# Patient Record
Sex: Male | Born: 1975 | Race: Asian | Hispanic: No | Marital: Married | Smoking: Former smoker
Health system: Southern US, Community
[De-identification: ages and names within clinical notes are randomized; demographics above are authoritative.]

## PROBLEM LIST (undated history)

## (undated) DIAGNOSIS — I10 Essential (primary) hypertension: Secondary | ICD-10-CM

## (undated) DIAGNOSIS — E785 Hyperlipidemia, unspecified: Secondary | ICD-10-CM

## (undated) DIAGNOSIS — R079 Chest pain, unspecified: Secondary | ICD-10-CM

## (undated) DIAGNOSIS — IMO0001 Reserved for inherently not codable concepts without codable children: Secondary | ICD-10-CM

## (undated) DIAGNOSIS — K219 Gastro-esophageal reflux disease without esophagitis: Secondary | ICD-10-CM

## (undated) HISTORY — DX: Hyperlipidemia, unspecified: E78.5

---

## 2009-04-19 DIAGNOSIS — R079 Chest pain, unspecified: Secondary | ICD-10-CM

## 2009-04-19 HISTORY — DX: Chest pain, unspecified: R07.9

## 2009-11-06 ENCOUNTER — Encounter (INDEPENDENT_AMBULATORY_CARE_PROVIDER_SITE_OTHER): Payer: Self-pay | Admitting: Internal Medicine

## 2009-11-06 ENCOUNTER — Observation Stay (HOSPITAL_COMMUNITY): Admission: EM | Admit: 2009-11-06 | Discharge: 2009-11-07 | Payer: Self-pay | Admitting: Emergency Medicine

## 2009-11-06 ENCOUNTER — Ambulatory Visit: Payer: Self-pay | Admitting: Cardiology

## 2009-11-07 ENCOUNTER — Ambulatory Visit: Payer: Self-pay | Admitting: Cardiology

## 2010-05-10 ENCOUNTER — Encounter: Payer: Self-pay | Admitting: Cardiology

## 2010-07-04 LAB — BASIC METABOLIC PANEL
BUN: 10 mg/dL (ref 6–23)
CO2: 28 mEq/L (ref 19–32)
Calcium: 8.5 mg/dL (ref 8.4–10.5)
Chloride: 105 mEq/L (ref 96–112)
Creatinine, Ser: 0.81 mg/dL (ref 0.4–1.5)
GFR calc Af Amer: >60 mL/min (ref >60)
GFR calc non Af Amer: >60 mL/min (ref >60)
Glucose, Bld: 128 mg/dL — ABNORMAL HIGH (ref 70–99)
Potassium: 4.5 mEq/L (ref 3.5–5.1)
Sodium: 138 mEq/L (ref 135–145)

## 2010-07-04 LAB — COMPREHENSIVE METABOLIC PANEL
ALT: 46 U/L (ref 0–53)
AST: 33 U/L (ref 0–37)
Albumin: 4 g/dL (ref 3.5–5.2)
Alkaline Phosphatase: 60 U/L (ref 39–117)
BUN: 9 mg/dL (ref 6–23)
CO2: 27 mEq/L (ref 19–32)
Calcium: 9.4 mg/dL (ref 8.4–10.5)
Chloride: 101 mEq/L (ref 96–112)
Creatinine, Ser: 0.8 mg/dL (ref 0.4–1.5)
GFR calc Af Amer: >60 mL/min (ref >60)
GFR calc non Af Amer: >60 mL/min (ref >60)
Glucose, Bld: 290 mg/dL — ABNORMAL HIGH (ref 70–99)
Potassium: 3.9 mEq/L (ref 3.5–5.1)
Sodium: 136 mEq/L (ref 135–145)
Total Bilirubin: 0.5 mg/dL (ref 0.3–1.2)
Total Protein: 7.6 g/dL (ref 6.0–8.3)

## 2010-07-04 LAB — CARDIAC PANEL(CRET KIN+CKTOT+MB+TROPI)
CK, MB: 1.3 ng/mL (ref 0.3–4.0)
CK, MB: 1.5 ng/mL (ref 0.3–4.0)
CK, MB: 1.6 ng/mL (ref 0.3–4.0)
Relative Index: 0.6 (ref 0.0–2.5)
Relative Index: 0.6 (ref 0.0–2.5)
Relative Index: 0.6 (ref 0.0–2.5)
Total CK: 209 U/L (ref 7–232)
Total CK: 240 U/L — ABNORMAL HIGH (ref 7–232)
Total CK: 254 U/L — ABNORMAL HIGH (ref 7–232)
Troponin I: 0.01 ng/mL (ref 0.00–0.06)
Troponin I: 0.01 ng/mL (ref 0.00–0.06)
Troponin I: <0.01 ng/mL (ref 0.00–0.06)

## 2010-07-04 LAB — DIFFERENTIAL
Basophils Absolute: 0.1 10*3/uL (ref 0.0–0.1)
Basophils Relative: 1 % (ref 0–1)
Eosinophils Absolute: 0.3 10*3/uL (ref 0.0–0.7)
Eosinophils Relative: 3 % (ref 0–5)
Lymphocytes Relative: 33 % (ref 12–46)
Lymphs Abs: 3.2 10*3/uL (ref 0.7–4.0)
Monocytes Absolute: 0.5 10*3/uL (ref 0.1–1.0)
Monocytes Relative: 5 % (ref 3–12)
Neutro Abs: 5.5 10*3/uL (ref 1.7–7.7)
Neutrophils Relative %: 58 % (ref 43–77)

## 2010-07-04 LAB — LIPID PANEL
Cholesterol: 143 mg/dL (ref 0–200)
HDL: 33 mg/dL — ABNORMAL LOW (ref >39)
LDL Cholesterol: 72 mg/dL (ref 0–99)
Total CHOL/HDL Ratio: 4.3 RATIO
Triglycerides: 189 mg/dL — ABNORMAL HIGH (ref <150)
VLDL: 38 mg/dL (ref 0–40)

## 2010-07-04 LAB — HEMOGLOBIN A1C
Hgb A1c MFr Bld: 8.5 % — ABNORMAL HIGH (ref <5.7)
Mean Plasma Glucose: 197 mg/dL — ABNORMAL HIGH (ref <117)

## 2010-07-04 LAB — CBC
HCT: 39 % (ref 39.0–52.0)
HCT: 43.4 % (ref 39.0–52.0)
Hemoglobin: 13.1 g/dL (ref 13.0–17.0)
Hemoglobin: 14.6 g/dL (ref 13.0–17.0)
MCH: 27.3 pg (ref 26.0–34.0)
MCH: 27.3 pg (ref 26.0–34.0)
MCHC: 33.6 g/dL (ref 30.0–36.0)
MCHC: 33.8 g/dL (ref 30.0–36.0)
MCV: 80.8 fL (ref 78.0–100.0)
MCV: 81.2 fL (ref 78.0–100.0)
Platelets: 200 10*3/uL (ref 150–400)
Platelets: 232 10*3/uL (ref 150–400)
RBC: 4.8 MIL/uL (ref 4.22–5.81)
RBC: 5.37 MIL/uL (ref 4.22–5.81)
RDW: 13.6 % (ref 11.5–15.5)
RDW: 14 % (ref 11.5–15.5)
WBC: 10 10*3/uL (ref 4.0–10.5)
WBC: 9.5 10*3/uL (ref 4.0–10.5)

## 2010-07-04 LAB — POCT CARDIAC MARKERS
CKMB, poc: 1.4 ng/mL (ref 1.0–8.0)
Myoglobin, poc: 96.3 ng/mL (ref 12–200)
Troponin i, poc: <0.05 ng/mL (ref 0.00–0.09)

## 2010-07-04 LAB — RAPID URINE DRUG SCREEN, HOSP PERFORMED
Amphetamines: NOT DETECTED
Barbiturates: NOT DETECTED
Benzodiazepines: NOT DETECTED
Cocaine: NOT DETECTED
Opiates: NOT DETECTED
Tetrahydrocannabinol: NOT DETECTED

## 2010-07-04 LAB — GLUCOSE, CAPILLARY
Glucose-Capillary: 130 mg/dL — ABNORMAL HIGH (ref 70–99)
Glucose-Capillary: 134 mg/dL — ABNORMAL HIGH (ref 70–99)
Glucose-Capillary: 137 mg/dL — ABNORMAL HIGH (ref 70–99)
Glucose-Capillary: 154 mg/dL — ABNORMAL HIGH (ref 70–99)
Glucose-Capillary: 157 mg/dL — ABNORMAL HIGH (ref 70–99)
Glucose-Capillary: 161 mg/dL — ABNORMAL HIGH (ref 70–99)

## 2010-07-04 LAB — D-DIMER, QUANTITATIVE: D-Dimer, Quant: <0.22 ug/mL-FEU (ref 0.00–0.48)

## 2011-07-19 ENCOUNTER — Other Ambulatory Visit: Payer: Self-pay | Admitting: Family Medicine

## 2011-07-19 ENCOUNTER — Ambulatory Visit
Admission: RE | Admit: 2011-07-19 | Discharge: 2011-07-19 | Disposition: A | Discharge status: Home / Self Care | Payer: Commercial Indemnity | Source: Ambulatory Visit | Attending: Family Medicine | Admitting: Family Medicine

## 2011-07-19 DIAGNOSIS — M25519 Pain in unspecified shoulder: Secondary | ICD-10-CM

## 2011-11-01 ENCOUNTER — Other Ambulatory Visit: Payer: Self-pay | Admitting: Family Medicine

## 2011-11-01 ENCOUNTER — Ambulatory Visit
Admission: RE | Admit: 2011-11-01 | Discharge: 2011-11-01 | Disposition: A | Discharge status: Home / Self Care | Payer: Commercial Indemnity | Source: Ambulatory Visit | Attending: Family Medicine | Admitting: Family Medicine

## 2011-11-01 DIAGNOSIS — M25561 Pain in right knee: Secondary | ICD-10-CM

## 2011-12-30 ENCOUNTER — Encounter (HOSPITAL_BASED_OUTPATIENT_CLINIC_OR_DEPARTMENT_OTHER): Payer: Self-pay | Admitting: *Deleted

## 2011-12-30 ENCOUNTER — Emergency Department (HOSPITAL_BASED_OUTPATIENT_CLINIC_OR_DEPARTMENT_OTHER): Payer: Managed Care, Other (non HMO)

## 2011-12-30 ENCOUNTER — Emergency Department (HOSPITAL_BASED_OUTPATIENT_CLINIC_OR_DEPARTMENT_OTHER)
Admission: EM | Admit: 2011-12-30 | Discharge: 2011-12-30 | Disposition: A | Discharge status: Home / Self Care | Payer: Managed Care, Other (non HMO) | Attending: Emergency Medicine | Admitting: Emergency Medicine

## 2011-12-30 DIAGNOSIS — F17200 Nicotine dependence, unspecified, uncomplicated: Secondary | ICD-10-CM | POA: Insufficient documentation

## 2011-12-30 DIAGNOSIS — E119 Type 2 diabetes mellitus without complications: Secondary | ICD-10-CM | POA: Insufficient documentation

## 2011-12-30 DIAGNOSIS — I10 Essential (primary) hypertension: Secondary | ICD-10-CM | POA: Insufficient documentation

## 2011-12-30 DIAGNOSIS — M62838 Other muscle spasm: Secondary | ICD-10-CM | POA: Insufficient documentation

## 2011-12-30 DIAGNOSIS — R0789 Other chest pain: Secondary | ICD-10-CM

## 2011-12-30 HISTORY — DX: Gastro-esophageal reflux disease without esophagitis: K21.9

## 2011-12-30 HISTORY — DX: Chest pain, unspecified: R07.9

## 2011-12-30 HISTORY — DX: Reserved for inherently not codable concepts without codable children: IMO0001

## 2011-12-30 HISTORY — DX: Essential (primary) hypertension: I10

## 2011-12-30 LAB — COMPREHENSIVE METABOLIC PANEL
ALT: 61 U/L — ABNORMAL HIGH (ref 0–53)
AST: 47 U/L — ABNORMAL HIGH (ref 0–37)
Albumin: 4.2 g/dL (ref 3.5–5.2)
Alkaline Phosphatase: 73 U/L (ref 39–117)
BUN: 9 mg/dL (ref 6–23)
CO2: 24 mEq/L (ref 19–32)
Calcium: 9.8 mg/dL (ref 8.4–10.5)
Chloride: 96 mEq/L (ref 96–112)
Creatinine, Ser: 0.6 mg/dL (ref 0.50–1.35)
GFR calc Af Amer: >90 mL/min (ref >90)
GFR calc non Af Amer: >90 mL/min (ref >90)
Glucose, Bld: 253 mg/dL — ABNORMAL HIGH (ref 70–99)
Potassium: 3.9 mEq/L (ref 3.5–5.1)
Sodium: 135 mEq/L (ref 135–145)
Total Bilirubin: 0.3 mg/dL (ref 0.3–1.2)
Total Protein: 8.2 g/dL (ref 6.0–8.3)

## 2011-12-30 LAB — CBC WITH DIFFERENTIAL/PLATELET
Basophils Absolute: 0 10*3/uL (ref 0.0–0.1)
Basophils Relative: 0 % (ref 0–1)
Eosinophils Absolute: 0.2 10*3/uL (ref 0.0–0.7)
Eosinophils Relative: 2 % (ref 0–5)
HCT: 44.8 % (ref 39.0–52.0)
Hemoglobin: 15.2 g/dL (ref 13.0–17.0)
Lymphocytes Relative: 28 % (ref 12–46)
Lymphs Abs: 2.9 10*3/uL (ref 0.7–4.0)
MCH: 25.8 pg — ABNORMAL LOW (ref 26.0–34.0)
MCHC: 33.9 g/dL (ref 30.0–36.0)
MCV: 76.1 fL — ABNORMAL LOW (ref 78.0–100.0)
Monocytes Absolute: 0.7 10*3/uL (ref 0.1–1.0)
Monocytes Relative: 7 % (ref 3–12)
Neutro Abs: 6.3 10*3/uL (ref 1.7–7.7)
Neutrophils Relative %: 62 % (ref 43–77)
Platelets: 250 10*3/uL (ref 150–400)
RBC: 5.89 MIL/uL — ABNORMAL HIGH (ref 4.22–5.81)
RDW: 13.9 % (ref 11.5–15.5)
WBC: 10.1 10*3/uL (ref 4.0–10.5)

## 2011-12-30 LAB — TROPONIN I
Troponin I: <0.3 ng/mL (ref <0.30)
Troponin I: <0.3 ng/mL (ref <0.30)

## 2011-12-30 LAB — URINALYSIS, ROUTINE W REFLEX MICROSCOPIC
Bilirubin Urine: NEGATIVE
Glucose, UA: NEGATIVE mg/dL
Hgb urine dipstick: NEGATIVE
Ketones, ur: NEGATIVE mg/dL
Leukocytes, UA: NEGATIVE
Nitrite: NEGATIVE
Protein, ur: NEGATIVE mg/dL
Specific Gravity, Urine: 1.01 (ref 1.005–1.030)
Urobilinogen, UA: 0.2 mg/dL (ref 0.0–1.0)
pH: 7 (ref 5.0–8.0)

## 2011-12-30 LAB — GLUCOSE, CAPILLARY: Glucose-Capillary: 272 mg/dL — ABNORMAL HIGH (ref 70–99)

## 2011-12-30 NOTE — ED Notes (Signed)
Patient states he developed a tremor in his lower extremities last night while sleeping.  States he woke up this morning with tingling in his left chest with mild sob.

## 2012-01-08 NOTE — ED Provider Notes (Signed)
History     CSN: [id number]  Arrival date & time 12/30/11  1216   First MD Initiated Contact with Patient 12/30/11 1309      Chief Complaint  Patient presents with  . Chest Pain    (Consider location/radiation/quality/duration/timing/severity/associated sxs/prior treatment) HPI The patient presents to the ER with tingling in his mid chest that began early this morning. The patient states that he had some tremor like activity in his lower legs for a few minutes last night. The patient states that he has not had SOB, weakness, numbness, vomiting, nausea, headache, fever, back pain, abdominal pain, syncope, or diarrhea.  The patient states that nothing seems to make the sensation better or worse. The patient does state that the tingling sensation is gone and has been since his arrival. The patient denies any treatment prior to arrival for his symptoms. The patient states that he had a cardiac workup in 2011. The patient states that at that time there was no abnormalities.The patient states that the sensation would only last for seconds at a time. Past Medical History  Diagnosis Date  . Chest pain 2011  . Hypertension   . Diabetes mellitus   . Reflux     History reviewed. No pertinent past surgical history.  No family history on file.  History  Substance Use Topics  . Smoking status: Current Every Day Smoker  . Smokeless tobacco: Not on file  . Alcohol Use: No      Review of Systems All other systems negative except as documented in the HPI. All pertinent positives and negatives as reviewed in the HPI.  Allergies  Review of patient's allergies indicates no known allergies.  Home Medications   Current Outpatient Rx  Name Route Sig Dispense Refill  . METFORMIN HCL 500 MG PO TABS Oral Take 500 mg by mouth 2 (two) times daily with a meal.    . OLMESARTAN MEDOXOMIL 20 MG PO TABS Oral Take 20 mg by mouth daily.    Marland Kitchen OMEPRAZOLE 20 MG PO CPDR Oral Take 20 mg by mouth daily.       BP 124/80  Pulse 93  Temp 98.4 F (36.9 C) (Oral)  Resp 18  Ht 6' (1.829 m)  Wt 225 lb (102.059 kg)  BMI 30.52 kg/m2  SpO2 97%  Physical Exam  Nursing note and vitals reviewed. Constitutional: He is oriented to person, place, and time. He appears well-developed and well-nourished.  HENT:  Head: Normocephalic and atraumatic.  Mouth/Throat: Oropharynx is clear and moist. No oropharyngeal exudate.  Eyes: Pupils are equal, round, and reactive to light.  Neck: Normal range of motion. Neck supple.  Cardiovascular: Normal rate, regular rhythm and normal heart sounds.  Exam reveals no gallop and no friction rub.   No murmur heard. Pulmonary/Chest: Effort normal and breath sounds normal.  Abdominal: Soft. Bowel sounds are normal. He exhibits no distension. There is no tenderness.  Neurological: He is alert and oriented to person, place, and time.  Skin: Skin is warm and dry. No rash noted.    ED Course  Procedures (including critical care time)  Labs Reviewed  GLUCOSE, CAPILLARY - Abnormal; Notable for the following:    Glucose-Capillary 272 (*)     All other components within normal limits  COMPREHENSIVE METABOLIC PANEL - Abnormal; Notable for the following:    Glucose, Bld 253 (*)     AST 47 (*)     ALT 61 (*)     All other components within normal  limits  CBC WITH DIFFERENTIAL - Abnormal; Notable for the following:    RBC 5.89 (*)     MCV 76.1 (*)     MCH 25.8 (*)     All other components within normal limits  URINALYSIS, ROUTINE W REFLEX MICROSCOPIC  TROPONIN I  TROPONIN I     1. Chest discomfort    The patient is advised that this does not seem likely to be a heart related issue based on his HPI and PE along with his testing here in the ER. I advised him that this can't totally be excluded but felt that he would need close follow up on these symptoms. The patient is PERC negative and is low risk based on Wells Criteria. The patient is advised to return to the ER  for any worsening in his condition. The patient was given the plan and all questions were answered.   MDM  MDM Reviewed: vitals and nursing note Interpretation: labs, ECG and x-ray    Date: 01/08/2012  Rate: 97  Rhythm: normal sinus rhythm  QRS Axis: normal  Intervals: normal  ST/T Wave abnormalities: nonspecific T wave changes  Conduction Disutrbances:none  Narrative Interpretation:   Old EKG Reviewed: none available           Carlyle Dolly, PA-C 01/08/12 0801

## 2012-01-10 NOTE — ED Provider Notes (Signed)
Medical screening examination/treatment/procedure(s) were performed by non-physician practitioner and as supervising physician I was immediately available for consultation/collaboration.  Shresta Risden K Linker, MD 01/10/12 0929 

## 2012-02-16 ENCOUNTER — Other Ambulatory Visit: Payer: Self-pay | Admitting: Endocrinology

## 2012-02-16 DIAGNOSIS — E049 Nontoxic goiter, unspecified: Secondary | ICD-10-CM

## 2012-02-28 ENCOUNTER — Ambulatory Visit
Admission: RE | Admit: 2012-02-28 | Discharge: 2012-02-28 | Disposition: A | Discharge status: Home / Self Care | Payer: Managed Care, Other (non HMO) | Source: Ambulatory Visit | Attending: Endocrinology | Admitting: Endocrinology

## 2012-02-28 DIAGNOSIS — E049 Nontoxic goiter, unspecified: Secondary | ICD-10-CM

## 2013-03-19 IMAGING — CR DG SHOULDER 2+V*R*
3 series · 3 of 3 positions shown · non-contrast
Comparison: None.

CLINICAL DATA: Intermittent right shoulder pain

RIGHT SHOULDER - 2+ VIEW

[view not recorded (1 of 3)]
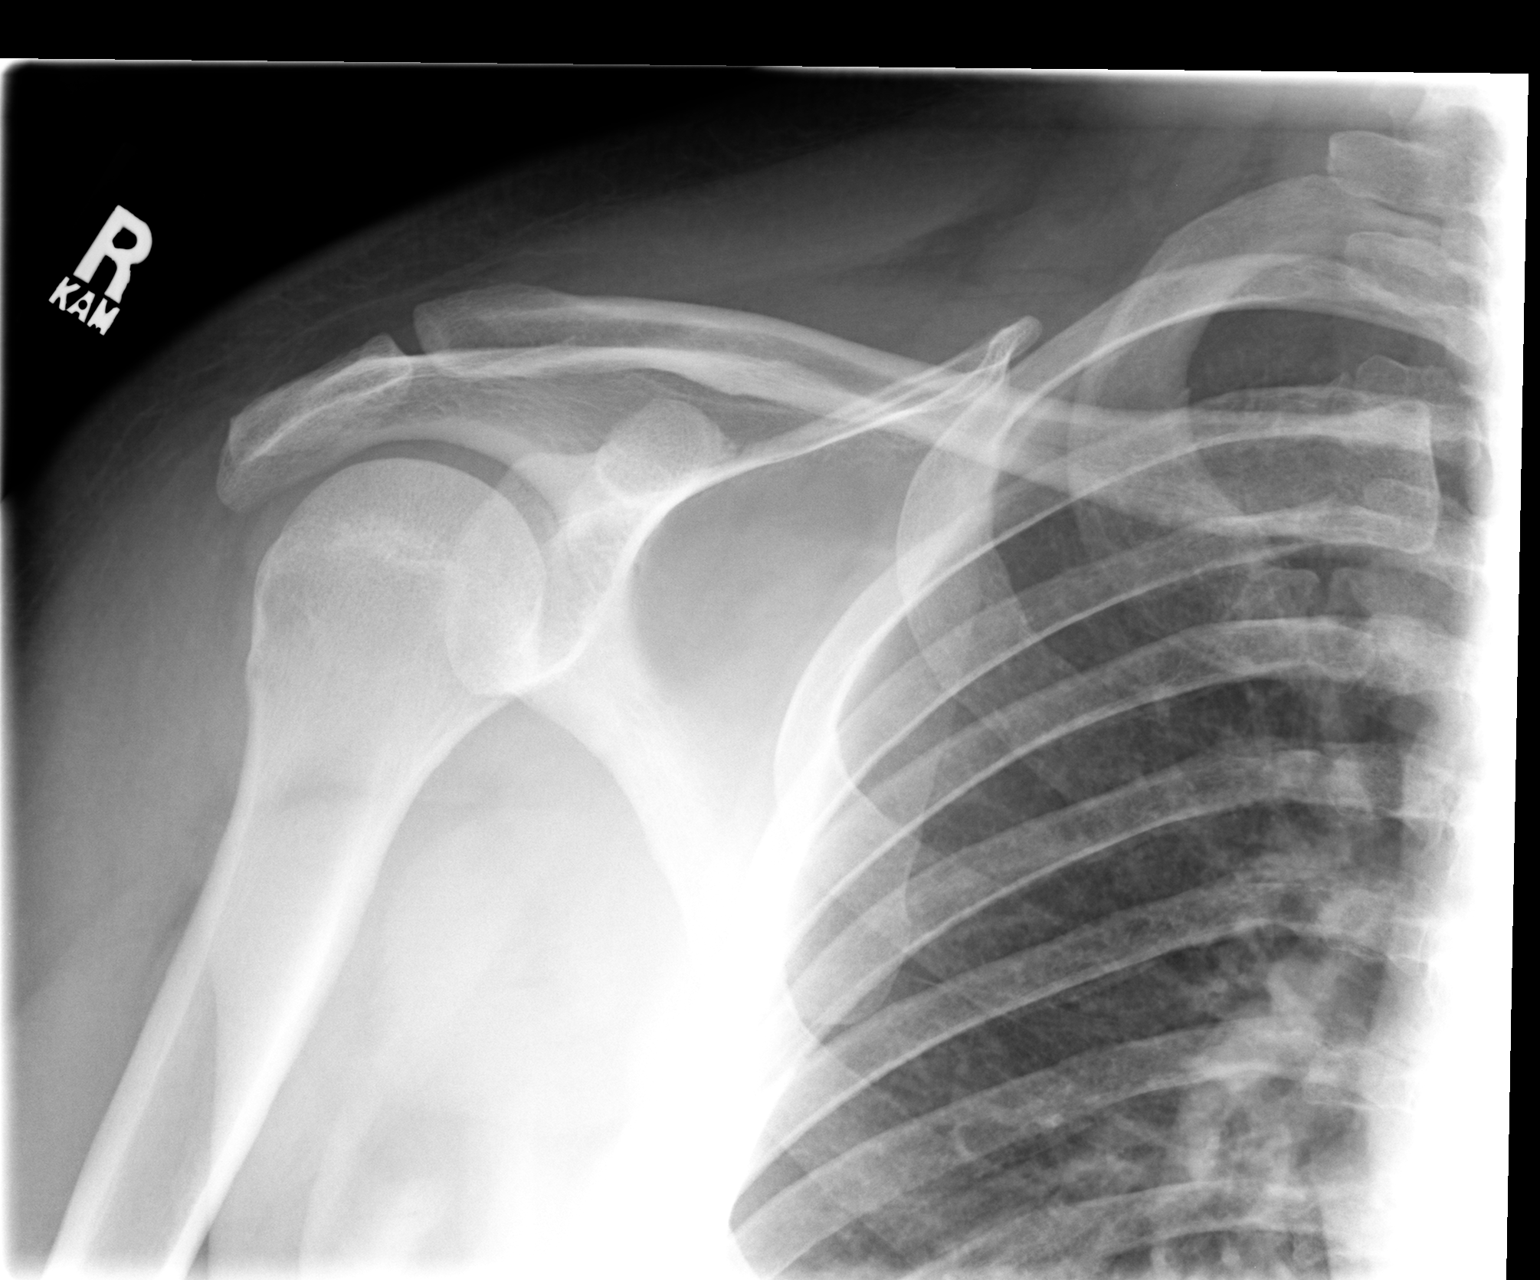

[view not recorded (2 of 3)]
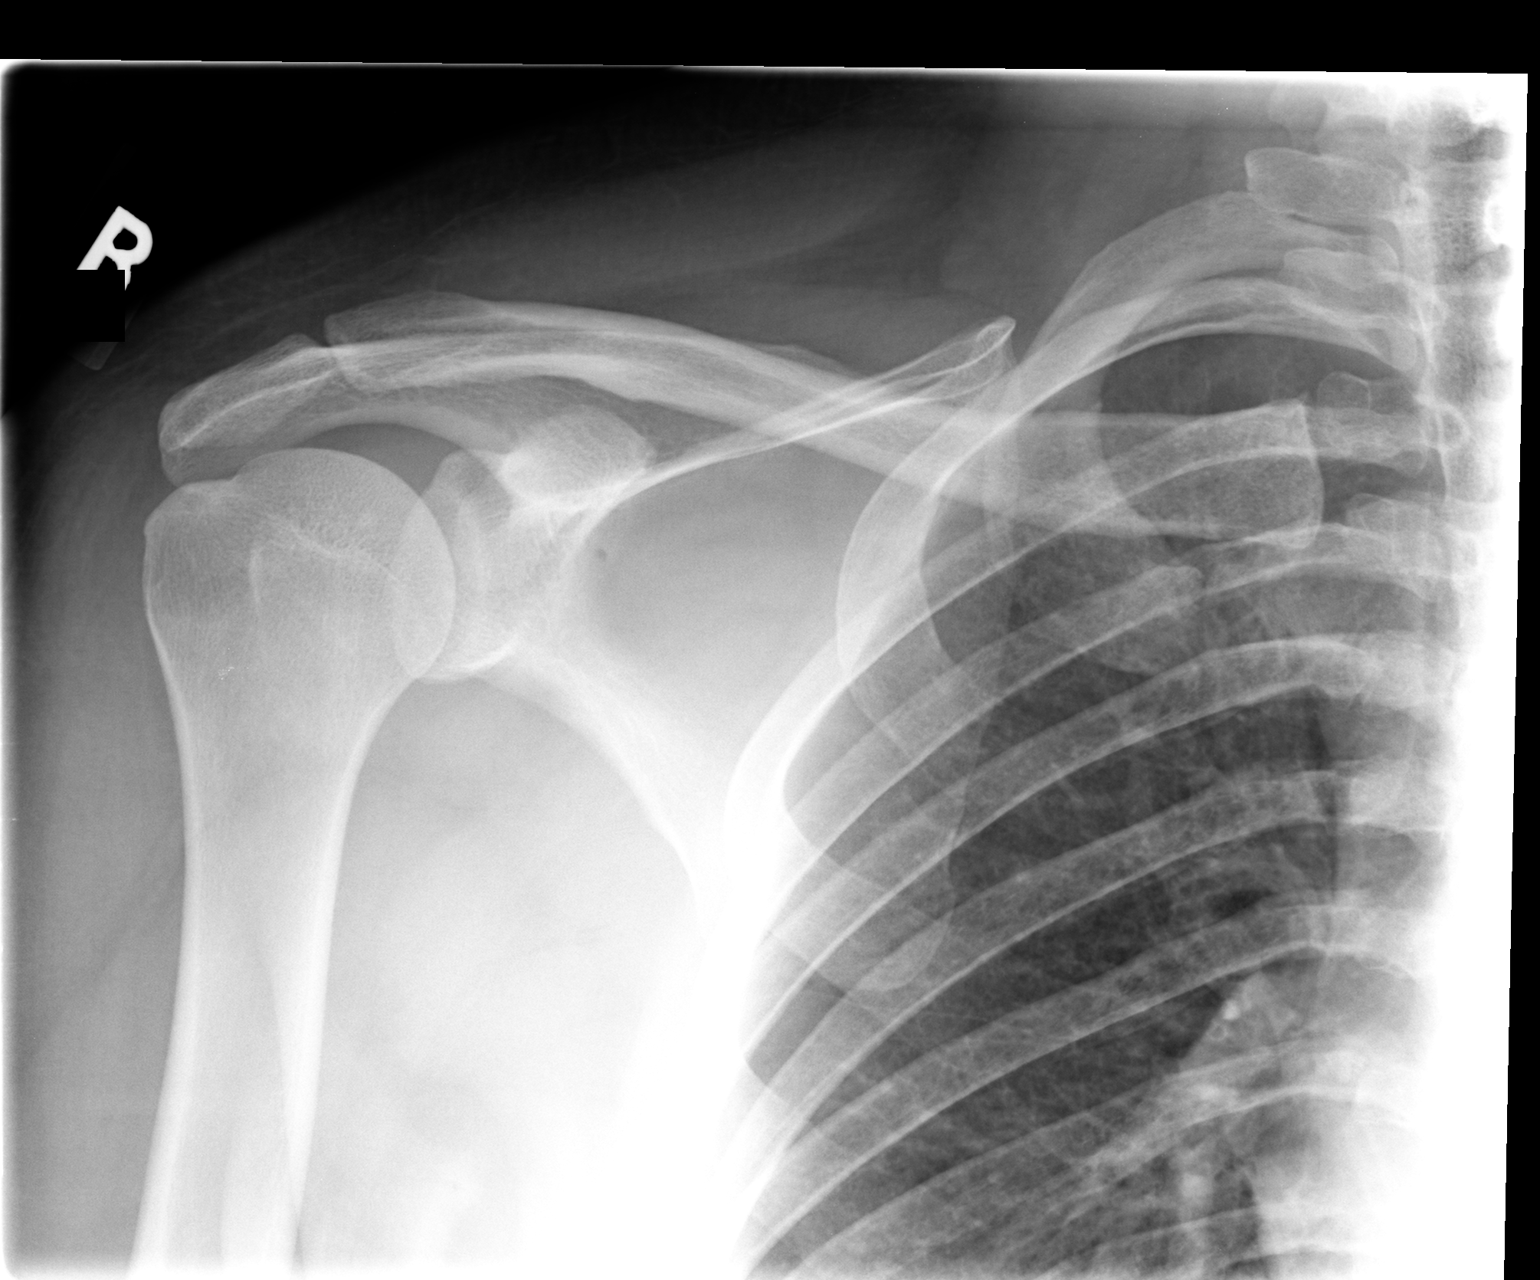

[view not recorded (3 of 3)]
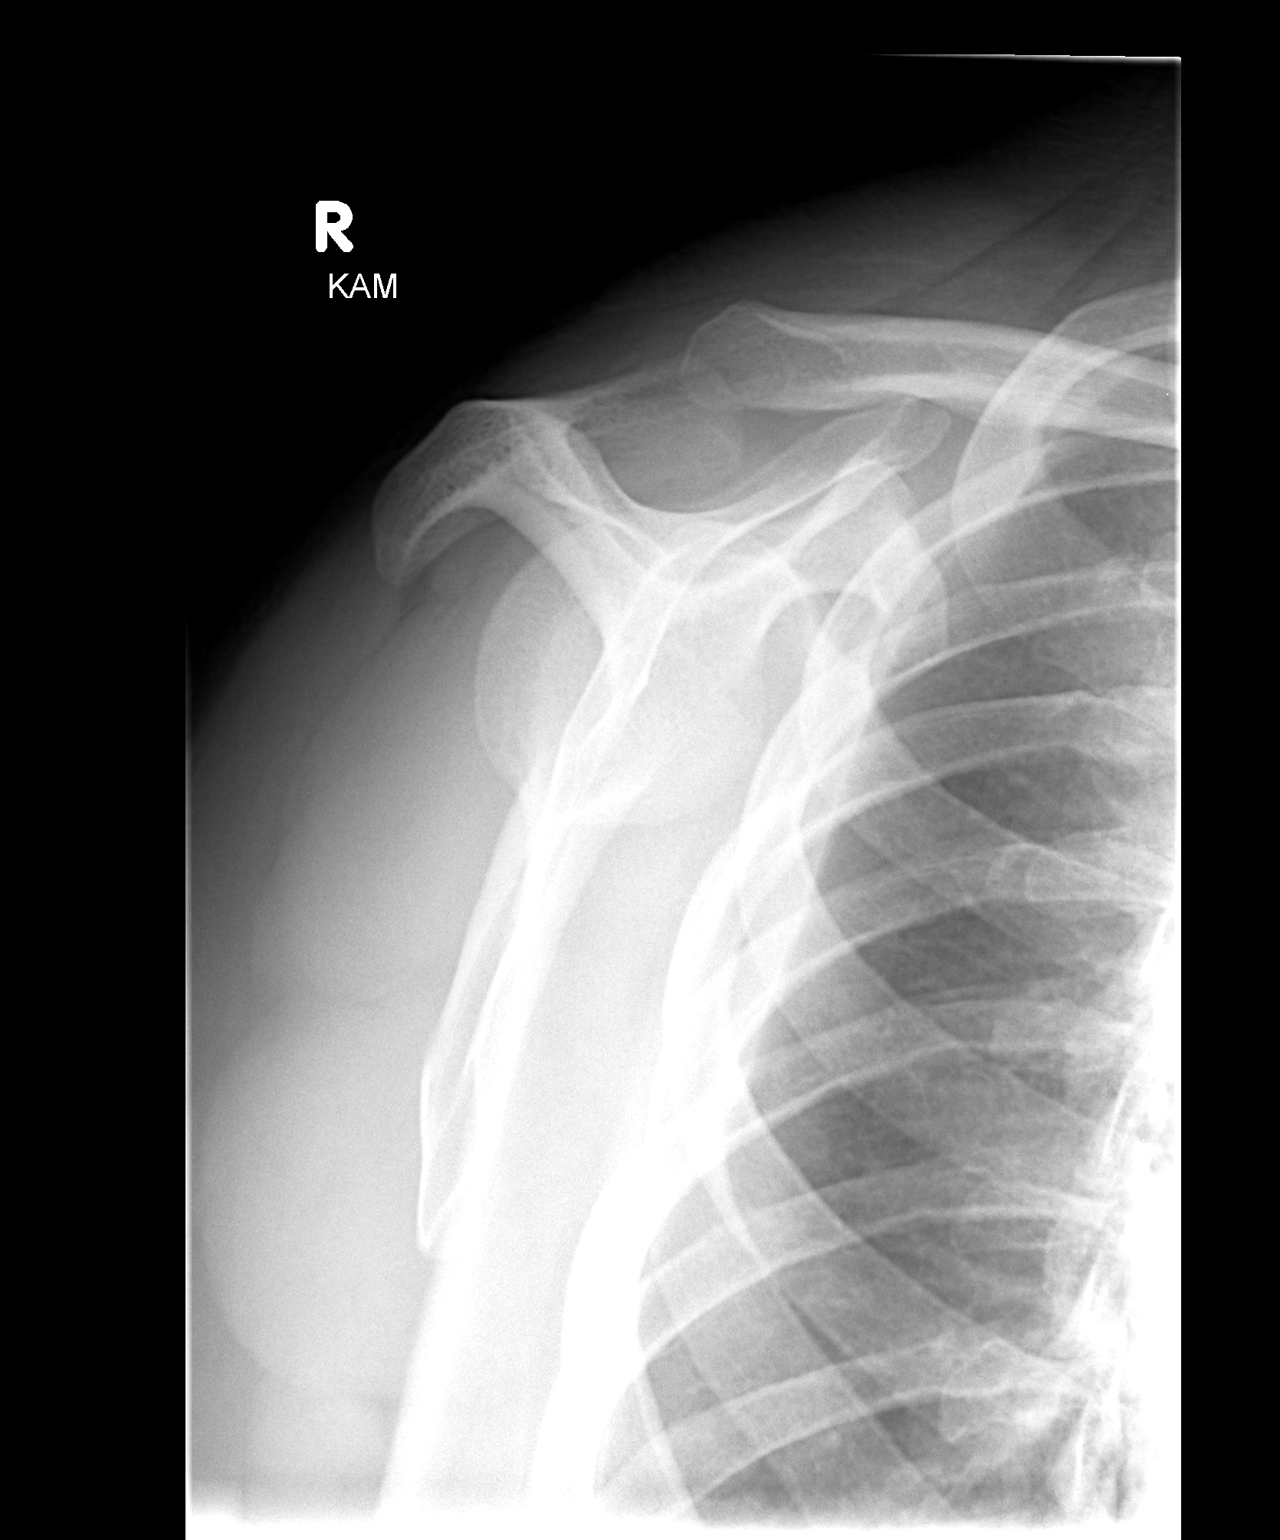

[3 of 3 positions shown; findings below may reference images not displayed]

FINDINGS: No acute fracture and no dislocation.  Unremarkable soft
tissues.
IMPRESSION: No acute bony pathology.

## 2013-03-19 IMAGING — CR DG CHEST 2V
2 series · 2 of 2 positions shown · non-contrast
Comparison: 11/06/2009.

CLINICAL DATA: 36-year-old male with left side chest pain.  Pain in
the ribs.

CHEST - 2 VIEW

[view not recorded (1 of 2)]
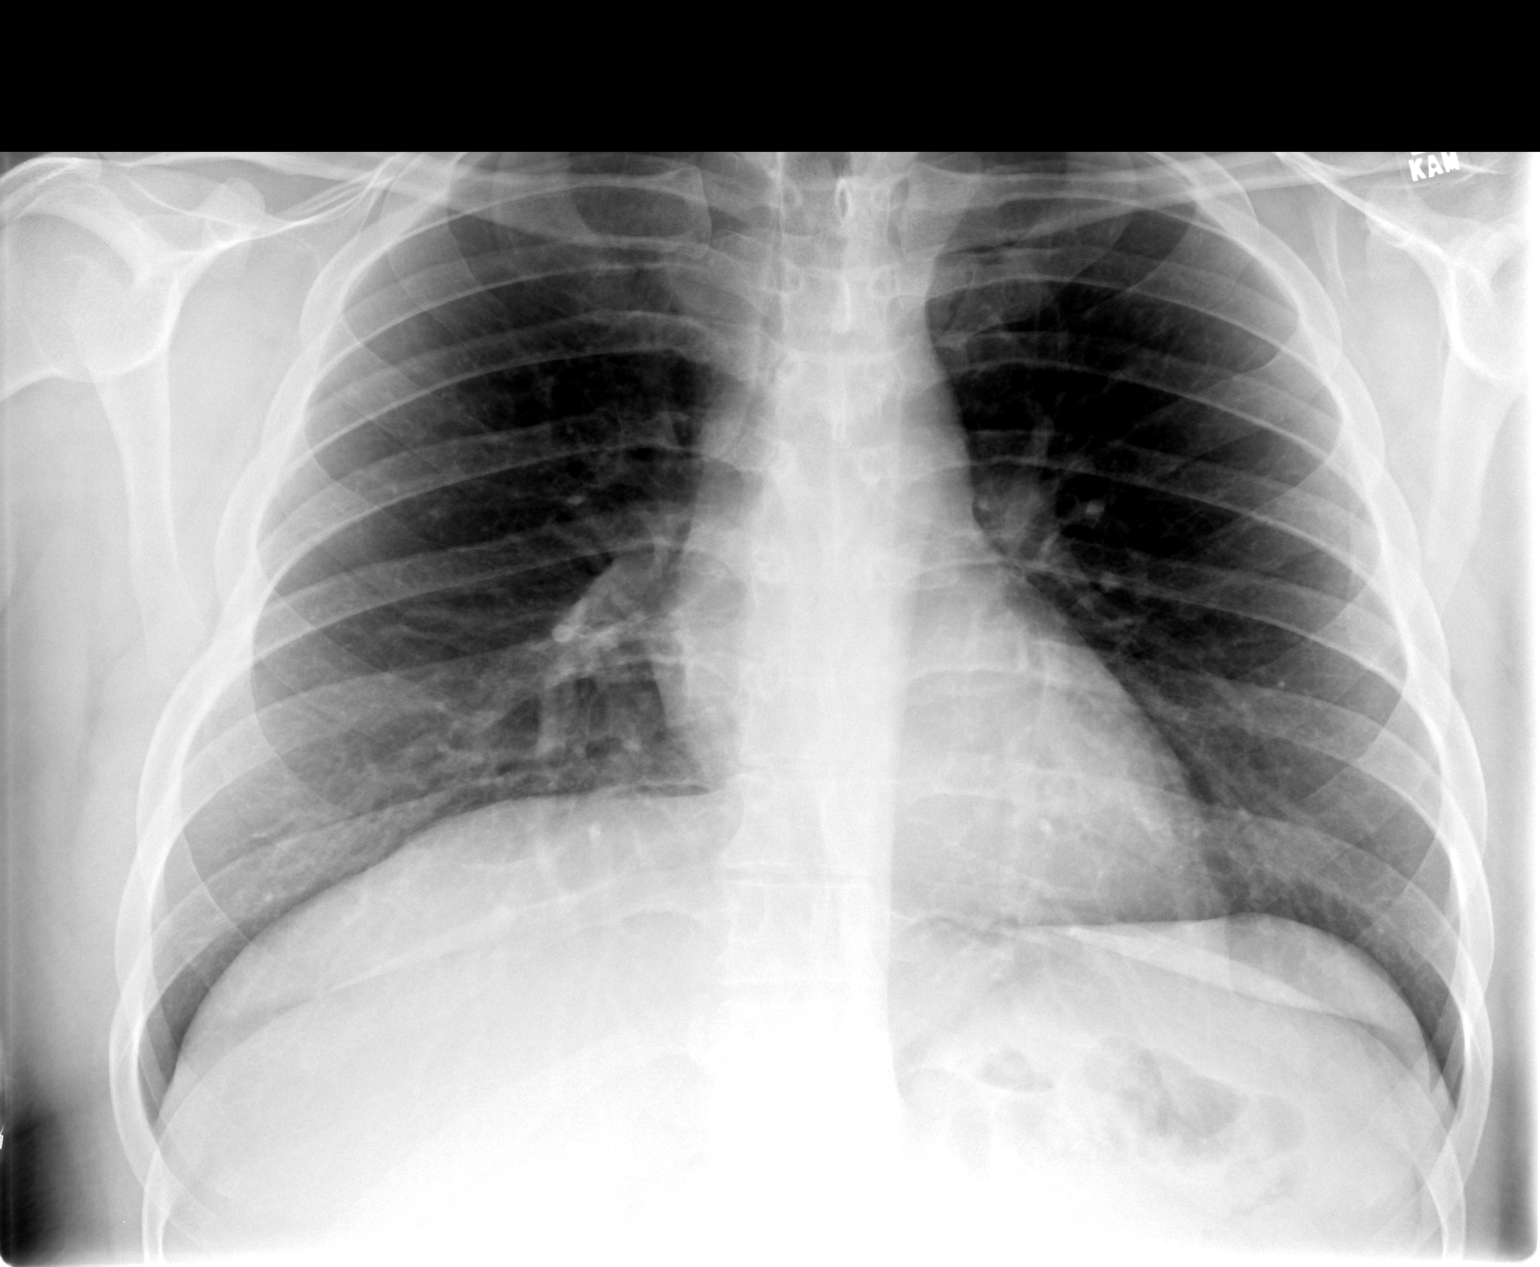

[view not recorded (2 of 2)]
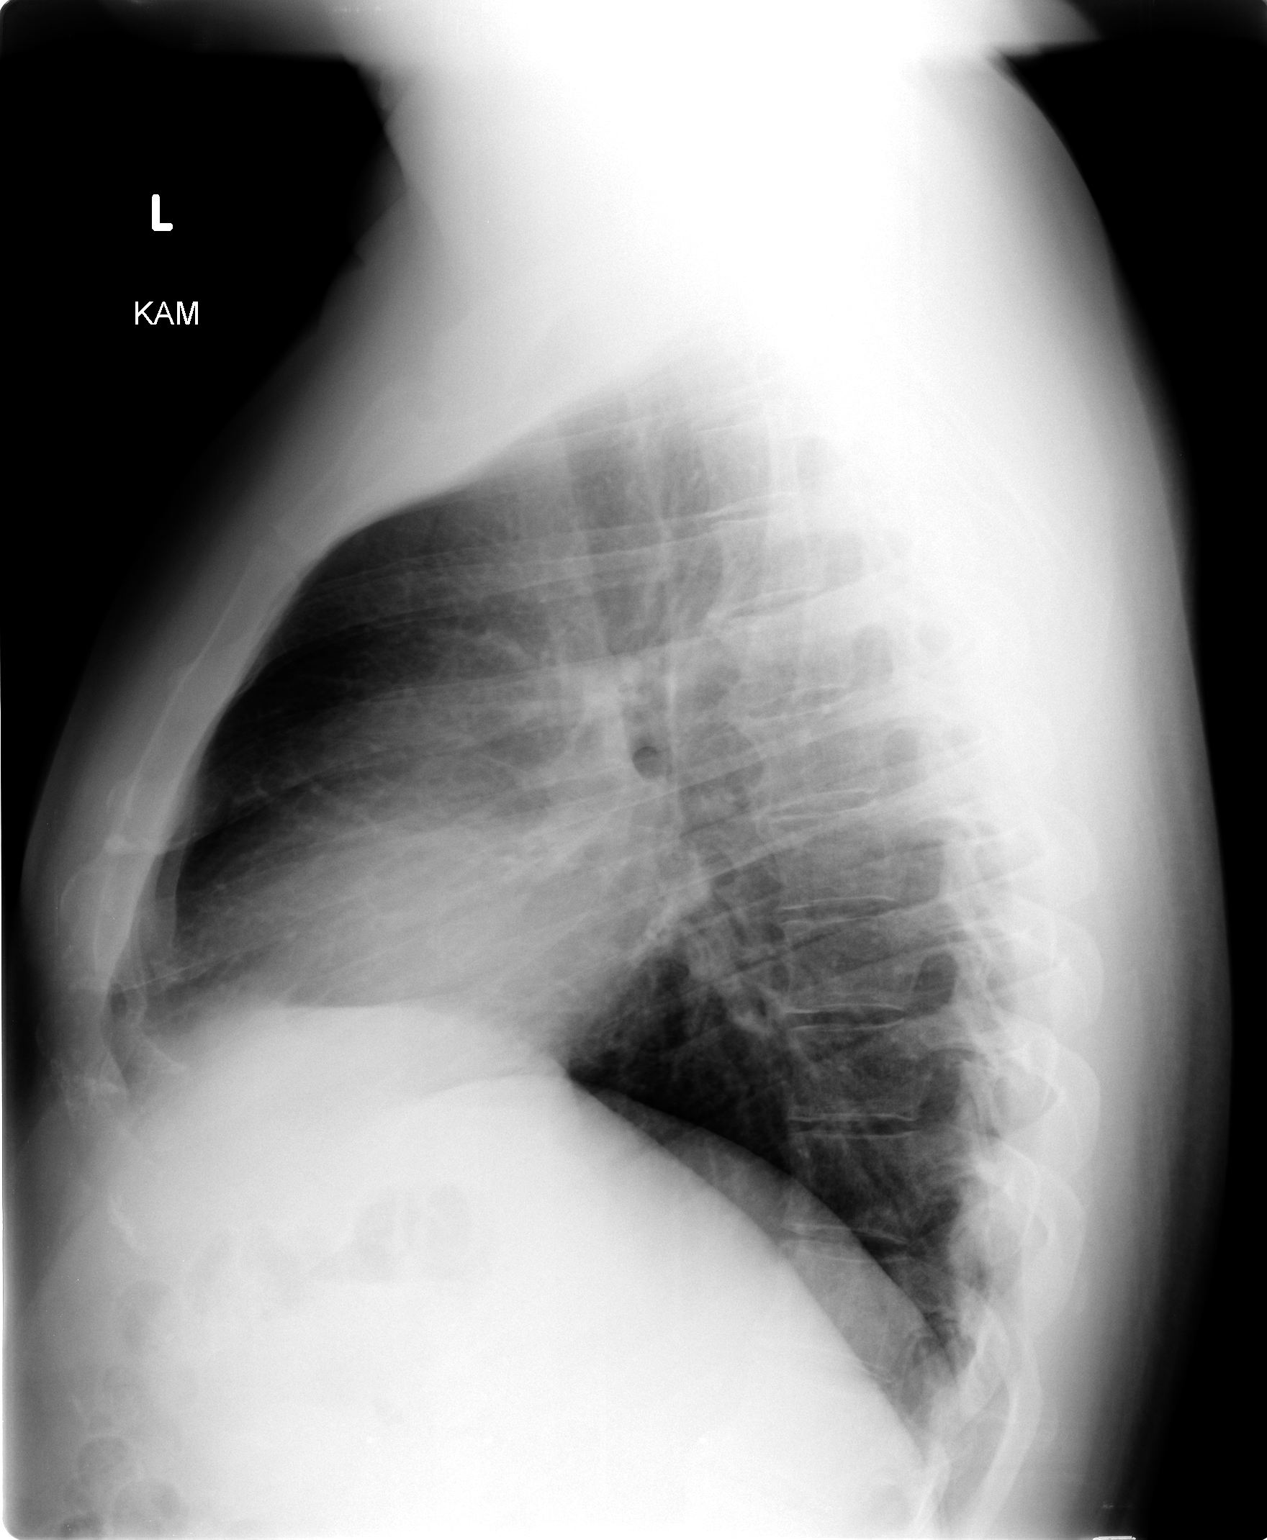

[2 of 2 positions shown; findings below may reference images not displayed]

FINDINGS: Normal lung volumes. Normal cardiac size and mediastinal
contours.  Visualized tracheal air column is within normal limits.
The lungs are clear.  No pneumothorax or effusion. Bone
mineralization is within normal limits. No acute osseous
abnormality identified.
IMPRESSION: Negative, no acute cardiopulmonary abnormality.

## 2015-02-04 ENCOUNTER — Other Ambulatory Visit: Payer: Self-pay | Admitting: Family Medicine

## 2015-02-04 ENCOUNTER — Ambulatory Visit
Admission: RE | Admit: 2015-02-04 | Discharge: 2015-02-04 | Disposition: A | Discharge status: Home / Self Care | Payer: Managed Care, Other (non HMO) | Source: Ambulatory Visit | Attending: Family Medicine | Admitting: Family Medicine

## 2015-02-04 DIAGNOSIS — M542 Cervicalgia: Secondary | ICD-10-CM

## 2017-11-14 DIAGNOSIS — E118 Type 2 diabetes mellitus with unspecified complications: Secondary | ICD-10-CM | POA: Diagnosis not present

## 2017-11-14 DIAGNOSIS — E785 Hyperlipidemia, unspecified: Secondary | ICD-10-CM | POA: Diagnosis not present

## 2017-11-14 DIAGNOSIS — I10 Essential (primary) hypertension: Secondary | ICD-10-CM | POA: Diagnosis not present

## 2017-12-26 DIAGNOSIS — E782 Mixed hyperlipidemia: Secondary | ICD-10-CM | POA: Diagnosis not present

## 2017-12-26 DIAGNOSIS — E118 Type 2 diabetes mellitus with unspecified complications: Secondary | ICD-10-CM | POA: Diagnosis not present

## 2017-12-26 DIAGNOSIS — I10 Essential (primary) hypertension: Secondary | ICD-10-CM | POA: Diagnosis not present

## 2018-01-17 DIAGNOSIS — H60312 Diffuse otitis externa, left ear: Secondary | ICD-10-CM | POA: Diagnosis not present

## 2018-03-08 DIAGNOSIS — Z0110 Encounter for examination of ears and hearing without abnormal findings: Secondary | ICD-10-CM | POA: Diagnosis not present

## 2018-03-08 DIAGNOSIS — H9313 Tinnitus, bilateral: Secondary | ICD-10-CM | POA: Diagnosis not present

## 2018-03-22 DIAGNOSIS — H9313 Tinnitus, bilateral: Secondary | ICD-10-CM | POA: Diagnosis not present

## 2018-04-24 DIAGNOSIS — E785 Hyperlipidemia, unspecified: Secondary | ICD-10-CM | POA: Diagnosis not present

## 2018-04-24 DIAGNOSIS — E118 Type 2 diabetes mellitus with unspecified complications: Secondary | ICD-10-CM | POA: Diagnosis not present

## 2018-04-24 DIAGNOSIS — I10 Essential (primary) hypertension: Secondary | ICD-10-CM | POA: Diagnosis not present

## 2018-05-05 DIAGNOSIS — H60311 Diffuse otitis externa, right ear: Secondary | ICD-10-CM | POA: Insufficient documentation

## 2018-05-05 DIAGNOSIS — H9202 Otalgia, left ear: Secondary | ICD-10-CM | POA: Diagnosis not present

## 2018-06-26 DIAGNOSIS — Z125 Encounter for screening for malignant neoplasm of prostate: Secondary | ICD-10-CM | POA: Diagnosis not present

## 2018-06-26 DIAGNOSIS — M542 Cervicalgia: Secondary | ICD-10-CM | POA: Diagnosis not present

## 2018-06-26 DIAGNOSIS — N400 Enlarged prostate without lower urinary tract symptoms: Secondary | ICD-10-CM | POA: Diagnosis not present

## 2018-08-29 DIAGNOSIS — S91301A Unspecified open wound, right foot, initial encounter: Secondary | ICD-10-CM | POA: Diagnosis not present

## 2018-08-29 DIAGNOSIS — Z7189 Other specified counseling: Secondary | ICD-10-CM | POA: Diagnosis not present

## 2018-08-29 DIAGNOSIS — E1169 Type 2 diabetes mellitus with other specified complication: Secondary | ICD-10-CM | POA: Diagnosis not present

## 2018-08-29 DIAGNOSIS — E1165 Type 2 diabetes mellitus with hyperglycemia: Secondary | ICD-10-CM | POA: Diagnosis not present

## 2018-09-05 DIAGNOSIS — E1169 Type 2 diabetes mellitus with other specified complication: Secondary | ICD-10-CM | POA: Diagnosis not present

## 2018-09-05 DIAGNOSIS — S91301D Unspecified open wound, right foot, subsequent encounter: Secondary | ICD-10-CM | POA: Diagnosis not present

## 2018-09-22 DIAGNOSIS — H9312 Tinnitus, left ear: Secondary | ICD-10-CM | POA: Diagnosis not present

## 2018-09-22 DIAGNOSIS — J342 Deviated nasal septum: Secondary | ICD-10-CM | POA: Diagnosis not present

## 2018-09-22 DIAGNOSIS — J310 Chronic rhinitis: Secondary | ICD-10-CM | POA: Diagnosis not present

## 2018-09-22 DIAGNOSIS — H9042 Sensorineural hearing loss, unilateral, left ear, with unrestricted hearing on the contralateral side: Secondary | ICD-10-CM | POA: Diagnosis not present

## 2018-10-31 DIAGNOSIS — H9042 Sensorineural hearing loss, unilateral, left ear, with unrestricted hearing on the contralateral side: Secondary | ICD-10-CM | POA: Diagnosis not present

## 2018-10-31 DIAGNOSIS — H9312 Tinnitus, left ear: Secondary | ICD-10-CM | POA: Diagnosis not present

## 2018-10-31 DIAGNOSIS — J310 Chronic rhinitis: Secondary | ICD-10-CM | POA: Diagnosis not present

## 2018-12-06 DIAGNOSIS — E1169 Type 2 diabetes mellitus with other specified complication: Secondary | ICD-10-CM | POA: Diagnosis not present

## 2018-12-06 DIAGNOSIS — E785 Hyperlipidemia, unspecified: Secondary | ICD-10-CM | POA: Diagnosis not present

## 2018-12-06 DIAGNOSIS — I10 Essential (primary) hypertension: Secondary | ICD-10-CM | POA: Diagnosis not present

## 2019-04-10 DIAGNOSIS — E782 Mixed hyperlipidemia: Secondary | ICD-10-CM | POA: Diagnosis not present

## 2019-04-10 DIAGNOSIS — E1169 Type 2 diabetes mellitus with other specified complication: Secondary | ICD-10-CM | POA: Diagnosis not present

## 2019-04-10 DIAGNOSIS — I10 Essential (primary) hypertension: Secondary | ICD-10-CM | POA: Diagnosis not present

## 2019-05-01 DIAGNOSIS — N400 Enlarged prostate without lower urinary tract symptoms: Secondary | ICD-10-CM | POA: Diagnosis not present

## 2019-05-01 DIAGNOSIS — Z125 Encounter for screening for malignant neoplasm of prostate: Secondary | ICD-10-CM | POA: Diagnosis not present

## 2019-07-23 ENCOUNTER — Ambulatory Visit: Payer: Self-pay | Attending: Internal Medicine

## 2019-07-23 DIAGNOSIS — Z23 Encounter for immunization: Secondary | ICD-10-CM

## 2019-07-23 NOTE — Progress Notes (Signed)
   Covid-19 Vaccination Clinic  Name:  Robi Dewolfe    MRN: [id number] DOB: 1975/04/22  07/23/2019  Mr. Ogawa was observed post Covid-19 immunization for 15 minutes without incident. He was provided with Vaccine Information Sheet and instruction to access the V-Safe system.   Mr. Shaheen was instructed to call 911 with any severe reactions post vaccine: Marland Kitchen Difficulty breathing  . Swelling of face and throat  . A fast heartbeat  . A bad rash all over body  . Dizziness and weakness   Immunizations Administered    Name Date Dose VIS Date Route   Pfizer COVID-19 Vaccine 07/23/2019 12:10 PM 0.3 mL 03/30/2019 Intramuscular   Manufacturer: [employer], [employer]   Lot: [id number]   NDC: [address]-2

## 2019-08-13 ENCOUNTER — Ambulatory Visit: Payer: Self-pay | Attending: Internal Medicine

## 2019-08-13 DIAGNOSIS — Z23 Encounter for immunization: Secondary | ICD-10-CM

## 2019-08-13 NOTE — Progress Notes (Signed)
   Covid-19 Vaccination Clinic  Name:  Philip [name]    MRN: [id number] DOB: [birth year]  08/13/2019  Philip [name] was observed post Covid-19 immunization for 15 minutes without incident. He was provided with Vaccine Information Sheet and instruction to access the V-Safe system.   Philip [name] was instructed to call 911 with any severe reactions post vaccine: Marland Kitchen Difficulty breathing  . Swelling of face and throat  . A fast heartbeat  . A bad rash all over body  . Dizziness and weakness   Immunizations Administered    Name Date Dose VIS Date Route   Pfizer COVID-19 Vaccine 08/13/2019  2:56 PM 0.3 mL 06/13/2018 Intramuscular   Manufacturer: [employer], [employer]   Lot: [id number]   NDC: [address]-5

## 2019-08-27 DIAGNOSIS — M542 Cervicalgia: Secondary | ICD-10-CM | POA: Diagnosis not present

## 2019-08-27 DIAGNOSIS — E1169 Type 2 diabetes mellitus with other specified complication: Secondary | ICD-10-CM | POA: Diagnosis not present

## 2019-08-27 DIAGNOSIS — E785 Hyperlipidemia, unspecified: Secondary | ICD-10-CM | POA: Diagnosis not present

## 2019-08-27 DIAGNOSIS — I10 Essential (primary) hypertension: Secondary | ICD-10-CM | POA: Diagnosis not present

## 2019-09-21 DIAGNOSIS — M5412 Radiculopathy, cervical region: Secondary | ICD-10-CM | POA: Diagnosis not present

## 2019-09-27 DIAGNOSIS — M542 Cervicalgia: Secondary | ICD-10-CM | POA: Diagnosis not present

## 2019-10-10 DIAGNOSIS — I10 Essential (primary) hypertension: Secondary | ICD-10-CM | POA: Diagnosis not present

## 2019-10-10 DIAGNOSIS — R079 Chest pain, unspecified: Secondary | ICD-10-CM | POA: Diagnosis not present

## 2019-10-10 DIAGNOSIS — R0789 Other chest pain: Secondary | ICD-10-CM | POA: Diagnosis not present

## 2019-10-10 DIAGNOSIS — R000 Tachycardia, unspecified: Secondary | ICD-10-CM | POA: Diagnosis not present

## 2019-10-11 DIAGNOSIS — E785 Hyperlipidemia, unspecified: Secondary | ICD-10-CM | POA: Diagnosis not present

## 2019-10-11 DIAGNOSIS — E782 Mixed hyperlipidemia: Secondary | ICD-10-CM | POA: Diagnosis not present

## 2019-10-11 DIAGNOSIS — R079 Chest pain, unspecified: Secondary | ICD-10-CM | POA: Diagnosis not present

## 2019-10-11 DIAGNOSIS — I10 Essential (primary) hypertension: Secondary | ICD-10-CM | POA: Diagnosis not present

## 2019-10-11 DIAGNOSIS — E119 Type 2 diabetes mellitus without complications: Secondary | ICD-10-CM | POA: Diagnosis not present

## 2019-10-13 DIAGNOSIS — E119 Type 2 diabetes mellitus without complications: Secondary | ICD-10-CM | POA: Diagnosis not present

## 2019-10-15 DIAGNOSIS — R42 Dizziness and giddiness: Secondary | ICD-10-CM | POA: Diagnosis not present

## 2019-10-15 DIAGNOSIS — R0789 Other chest pain: Secondary | ICD-10-CM | POA: Diagnosis not present

## 2019-10-15 DIAGNOSIS — E1169 Type 2 diabetes mellitus with other specified complication: Secondary | ICD-10-CM | POA: Diagnosis not present

## 2019-10-15 NOTE — Progress Notes (Signed)
Patient referred by Philip Hawks, NP for chest pain  Subjective:   Philip [name], male    DOB: [birth year], 44 y.o.   MRN: [id number]   Chief Complaint  Patient presents with  . Chest Pain  . New Patient (Initial Visit)     HPI  44 y.o. Philip [name] American male with hypertension, diabetes melitus, strong family history of early CAD, with chest pain.  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  Last week, patient came home from work, had dinner and sitting in front of TV when he had an episode of left-sided chest pain, 8/10, associated with palpitations.  He called 911, was assessed by EMS.  He was found to be diaphoretic and hypotensive with blood pressure 190/100, associated with left arm tingling and numbness.  This was also associated with "brain fogginess".  His symptoms eventually resolved.  Twelve-lead EKG was performed, but patient did not go to the hospital.  Since then, patient has continued to had intermittent episodes of chest pain with radiation to left arm.  He states that these episodes bring the feeling of "impending doom".  He also endorses exertional dyspnea while climbing up stairs.  Patient is strong family history of early CAD with his father having had myocardial infarction in his 80s.  Patient himself has relatively well-controlled hypertension, diabetes and hyperlipidemia.  He is a former smoker, with 7.5-pack-year smoking history, quit in 2017.  Patient currently takes ibuprofen every day for neck pain.  He does not have any history of gastric ulcers, melena or hematochezia.  Patient last saw Dr. Susa Day in 2011.   Past Medical History:  Diagnosis Date  . Chest pain 2011  . Diabetes mellitus   . Hyperlipidemia   . Hypertension   . Reflux      History reviewed. No pertinent surgical history.   Social History   Tobacco Use  Smoking Status Former Smoker  . Packs/day: 0.50  . Years:  15.00  . Pack years: 7.50  . Types: Cigarettes  . Quit date: 2017  . Years since quitting: 4.4  Smokeless Tobacco Never Used    Social History   Substance and Sexual Activity  Alcohol Use No     Family History  Problem Relation Age of Onset  . Hyperlipidemia Mother   . CAD Father        MI in 81s  . Hypertension Father      Current Outpatient Medications on File Prior to Visit  Medication Sig Dispense Refill  . cyclobenzaprine (FLEXERIL) 5 MG tablet Take 5 mg by mouth 3 (three) times daily as needed for muscle spasms.    Marland Kitchen KRILL OIL PO Take 100 mg by mouth daily.    . metFORMIN (GLUCOPHAGE) 500 MG tablet Take 500 mg by mouth 2 (two) times daily with a meal.    . metoprolol succinate (TOPROL-XL) 25 MG 24 hr tablet Take 25 mg by mouth daily.    Marland Kitchen olmesartan-hydrochlorothiazide (BENICAR HCT) 40-12.5 MG tablet Take 1 tablet by mouth daily.    . Omega-3 Fatty Acids (FISH OIL) 1000 MG CAPS Take 1 capsule by mouth daily.    Marland Kitchen omeprazole (PRILOSEC) 20 MG capsule Take 20 mg by mouth daily.    . pioglitazone (ACTOS) 30 MG tablet Take 30 mg by mouth daily.    . simvastatin (ZOCOR) 20 MG tablet Take 20 mg by mouth daily.     No current facility-administered  medications on file prior to visit.    Cardiovascular and other pertinent studies:  EKG 10/16/2019: Sinus rhythm 84 bpm Normal EKG    Echocardiogram 2011: - Left ventricle: The cavity size was normal. Wall thickness was   normal. Systolic function was normal. The estimated ejection   fraction was in the range of 60% to 65%. Wall motion was normal;   there were no regional wall motion abnormalities. Left ventricular   diastolic function parameters were normal.  - Aortic valve: There was no stenosis.  - Mitral valve: No regurgitation.  - Right ventricle: The cavity size was normal. Systolic function was   normal.  - Pulmonary arteries: No TR doppler jet so unable to estimate PA   systolic pressure.  -  Inferior vena cava: The vessel was normal in size; the   respirophasic diameter changes were in the normal range (= 50%);   findings are consistent with normal central venous pressure.    Recent labs: 10/11/2019: Glucose 113, BUN/Cr 24/1.56. EGFR 54. Na/K 136/5.3. Rest of the CMP normal H/H 12.2/38.1. MCV 80.2. Platelets 278 HbA1C N/A Chol 146, TG 236, HDL 42, LDL 71  TSH N/A Trop I, BNP normal    Review of Systems  Cardiovascular: Positive for chest pain and dyspnea on exertion. Negative for leg swelling, palpitations and syncope.  Neurological: Positive for dizziness.         Vitals:   10/16/19 0820  BP: (!) 140/91  Pulse: 89  Resp: 17  SpO2: 100%     Body mass index is 29.02 kg/m. Filed Weights   10/16/19 0820  Weight: 214 lb (97.1 kg)     Objective:   Physical Exam Vitals and nursing note reviewed.  Constitutional:      General: He is not in acute distress. Neck:     Vascular: No JVD.  Cardiovascular:     Rate and Rhythm: Normal rate and regular rhythm.     Pulses: Intact distal pulses.     Heart sounds: Normal heart sounds. No murmur heard.   Pulmonary:     Effort: Pulmonary effort is normal.     Breath sounds: Normal breath sounds. No wheezing or rales.         Assessment & Recommendations:   44 y.o. Philip [name] American male with hypertension, diabetes melitus, strong family history of early CAD, with chest pain.  Chest pain: High pretest probability for CAD given his hypertension, diabetes, hyperlipidemia, family history of CAD, and history of presenting symptoms concerning for unstable angina.  He may best benefit from proceeding with invasive coronary angiography with possible intervention.  Started aspirin 81 mg daily, Imdur 30 mg daily.  Continue metoprolol succinate, hold olmesartan HCTZ on the day of coronary angiogram.  Patient's PCP has ordered a CT scan to rule out any intracranial abnormality given his complaints of dizziness and  "fogginess" on the day of his unstable angina last week.  My suspicion of any significant neurological pathology is low.  I do not see any contraindication for the use of antiplatelet therapy.  Given his ongoing use of ibuprofen, I have counseled the patient regarding high bleeding risk and recommended that he use Tylenol instead of ibuprofen for his neck pain.  Hypertension: Blood pressure elevated today.  Added Imdur 30 mg daily.  Type 2 diabetes mellitus: Controlled  Hyperlipidemia: Controlled.  Continue simvastatin for milligram daily for now.  If he is found to have coronary artery disease, will switch to high intensity statin.  Further  recommendations after above work-up.     Thank you for referring the patient to [place]. Please feel free to contact with any questions.  Nigel Mormon, MD [facility] Pager: [phone] Office: [phone]

## 2019-10-15 NOTE — H&P (View-Only) (Signed)
Patient referred by [name], NP for chest pain  Subjective:   Philip [name], male    DOB: July 22, 1975, 44 y.o.   MRN: [id number]   Chief Complaint  Patient presents with  . Chest Pain  . New Patient (Initial Visit)     HPI  44 y.o. [name] male with hypertension, diabetes melitus, strong family history of early CAD, with chest pain.  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  Last week, patient came home from work, had dinner and sitting in front of TV when he had an episode of left-sided chest pain, 8/10, associated with palpitations.  He called 911, was assessed by EMS.  He was found to be diaphoretic and hypotensive with blood pressure 190/100, associated with left arm tingling and numbness.  This was also associated with "brain fogginess".  His symptoms eventually resolved.  Twelve-lead EKG was performed, but patient did not go to the hospital.  Since then, patient has continued to had intermittent episodes of chest pain with radiation to left arm.  He states that these episodes bring the feeling of "impending doom".  He also endorses exertional dyspnea while climbing up stairs.  Patient is strong family history of early CAD with his father having had myocardial infarction in his 68s.  Patient himself has relatively well-controlled hypertension, diabetes and hyperlipidemia.  He is a former smoker, with 7.5-pack-year smoking history, quit in 2017.  Patient currently takes ibuprofen every day for neck pain.  He does not have any history of gastric ulcers, melena or hematochezia.  Patient last saw Dr. Susa Day in 2011.   Past Medical History:  Diagnosis Date  . Chest pain 2011  . Diabetes mellitus   . Hyperlipidemia   . Hypertension   . Reflux      History reviewed. No pertinent surgical history.   Social History   Tobacco Use  Smoking Status Former Smoker  . Packs/day: 0.50  . Years:  15.00  . Pack years: 7.50  . Types: Cigarettes  . Quit date: 2017  . Years since quitting: 4.4  Smokeless Tobacco Never Used    Social History   Substance and Sexual Activity  Alcohol Use No     Family History  Problem Relation Age of Onset  . Hyperlipidemia Mother   . CAD Father        MI in 60s  . Hypertension Father      Current Outpatient Medications on File Prior to Visit  Medication Sig Dispense Refill  . cyclobenzaprine (FLEXERIL) 5 MG tablet Take 5 mg by mouth 3 (three) times daily as needed for muscle spasms.    Marland Kitchen KRILL OIL PO Take 100 mg by mouth daily.    . metFORMIN (GLUCOPHAGE) 500 MG tablet Take 500 mg by mouth 2 (two) times daily with a meal.    . metoprolol succinate (TOPROL-XL) 25 MG 24 hr tablet Take 25 mg by mouth daily.    Marland Kitchen olmesartan-hydrochlorothiazide (BENICAR HCT) 40-12.5 MG tablet Take 1 tablet by mouth daily.    . Omega-3 Fatty Acids (FISH OIL) 1000 MG CAPS Take 1 capsule by mouth daily.    Marland Kitchen omeprazole (PRILOSEC) 20 MG capsule Take 20 mg by mouth daily.    . pioglitazone (ACTOS) 30 MG tablet Take 30 mg by mouth daily.    . simvastatin (ZOCOR) 20 MG tablet Take 20 mg by mouth daily.     No current facility-administered  medications on file prior to visit.    Cardiovascular and other pertinent studies:  EKG 10/16/2019: Sinus rhythm 84 bpm Normal EKG    Echocardiogram 2011: - Left ventricle: The cavity size was normal. Wall thickness was   normal. Systolic function was normal. The estimated ejection   fraction was in the range of 60% to 65%. Wall motion was normal;   there were no regional wall motion abnormalities. Left ventricular   diastolic function parameters were normal.  - Aortic valve: There was no stenosis.  - Mitral valve: No regurgitation.  - Right ventricle: The cavity size was normal. Systolic function was   normal.  - Pulmonary arteries: No TR doppler jet so unable to estimate PA   systolic pressure.  -  Inferior vena cava: The vessel was normal in size; the   respirophasic diameter changes were in the normal range (= 50%);   findings are consistent with normal central venous pressure.    Recent labs: 10/11/2019: Glucose 113, BUN/Cr 24/1.56. EGFR 54. Na/K 136/5.3. Rest of the CMP normal H/H 12.2/38.1. MCV 80.2. Platelets 278 HbA1C N/A Chol 146, TG 236, HDL 42, LDL 71  TSH N/A Trop I, BNP normal    Review of Systems  Cardiovascular: Positive for chest pain and dyspnea on exertion. Negative for leg swelling, palpitations and syncope.  Neurological: Positive for dizziness.         Vitals:   10/16/19 0820  BP: (!) 140/91  Pulse: 89  Resp: 17  SpO2: 100%     Body mass index is 29.02 kg/m. Filed Weights   10/16/19 0820  Weight: 214 lb (97.1 kg)     Objective:   Physical Exam Vitals and nursing note reviewed.  Constitutional:      General: He is not in acute distress. Neck:     Vascular: No JVD.  Cardiovascular:     Rate and Rhythm: Normal rate and regular rhythm.     Pulses: Intact distal pulses.     Heart sounds: Normal heart sounds. No murmur heard.   Pulmonary:     Effort: Pulmonary effort is normal.     Breath sounds: Normal breath sounds. No wheezing or rales.         Assessment & Recommendations:   44 y.o. [name] male with hypertension, diabetes melitus, strong family history of early CAD, with chest pain.  Chest pain: High pretest probability for CAD given his hypertension, diabetes, hyperlipidemia, family history of CAD, and history of presenting symptoms concerning for unstable angina.  He may best benefit from proceeding with invasive coronary angiography with possible intervention.  Started aspirin 81 mg daily, Imdur 30 mg daily.  Continue metoprolol succinate, hold olmesartan HCTZ on the day of coronary angiogram.  Patient's PCP has ordered a CT scan to rule out any intracranial abnormality given his complaints of dizziness and  "fogginess" on the day of his unstable angina last week.  My suspicion of any significant neurological pathology is low.  I do not see any contraindication for the use of antiplatelet therapy.  Given his ongoing use of ibuprofen, I have counseled the patient regarding high bleeding risk and recommended that he use Tylenol instead of ibuprofen for his neck pain.  Hypertension: Blood pressure elevated today.  Added Imdur 30 mg daily.  Type 2 diabetes mellitus: Controlled  Hyperlipidemia: Controlled.  Continue simvastatin for milligram daily for now.  If he is found to have coronary artery disease, will switch to high intensity statin.  Further  recommendations after above work-up.     Thank you for referring the patient to [place]. Please feel free to contact with any questions.  Nigel Mormon, MD [facility] [place] Pager: [phone] Office: [phone]

## 2019-10-16 ENCOUNTER — Encounter: Payer: Self-pay | Admitting: Cardiology

## 2019-10-16 ENCOUNTER — Other Ambulatory Visit: Payer: Self-pay

## 2019-10-16 ENCOUNTER — Ambulatory Visit: Payer: BC Managed Care – PPO | Admitting: Cardiology

## 2019-10-16 ENCOUNTER — Other Ambulatory Visit: Payer: BC Managed Care – PPO

## 2019-10-16 VITALS — BP 127/82 | HR 87 | Resp 17 | Ht 72.0 in | Wt 214.0 lb

## 2019-10-16 DIAGNOSIS — E119 Type 2 diabetes mellitus without complications: Secondary | ICD-10-CM | POA: Diagnosis not present

## 2019-10-16 DIAGNOSIS — E782 Mixed hyperlipidemia: Secondary | ICD-10-CM

## 2019-10-16 DIAGNOSIS — I200 Unstable angina: Secondary | ICD-10-CM | POA: Insufficient documentation

## 2019-10-16 DIAGNOSIS — I10 Essential (primary) hypertension: Secondary | ICD-10-CM

## 2019-10-16 MED ORDER — ASPIRIN 81 MG PO CHEW: 81.0000 mg | CHEWABLE_TABLET | Freq: Every day | ORAL | 2 refills | Status: DC

## 2019-10-16 MED ORDER — NITROGLYCERIN 0.4 MG SL SUBL: 0.4000 mg | SUBLINGUAL_TABLET | SUBLINGUAL | 3 refills | Status: DC | PRN

## 2019-10-16 MED ORDER — ISOSORBIDE MONONITRATE ER 30 MG PO TB24
30.0000 mg | ORAL_TABLET | Freq: Every day | ORAL | 3 refills | Status: DC
Start: 2019-10-16 — End: 2019-10-29

## 2019-10-18 ENCOUNTER — Encounter (HOSPITAL_COMMUNITY)
Admission: RE | Disposition: A | Discharge status: Home / Self Care | Payer: Self-pay | Source: Ambulatory Visit | Attending: Cardiology

## 2019-10-18 ENCOUNTER — Ambulatory Visit (HOSPITAL_COMMUNITY)
Admission: RE | Admit: 2019-10-18 | Discharge: 2019-10-18 | Disposition: A | Discharge status: Home / Self Care | Payer: BC Managed Care – PPO | Source: Ambulatory Visit | Attending: Cardiology | Admitting: Cardiology

## 2019-10-18 ENCOUNTER — Other Ambulatory Visit: Payer: Self-pay

## 2019-10-18 DIAGNOSIS — I10 Essential (primary) hypertension: Secondary | ICD-10-CM | POA: Diagnosis not present

## 2019-10-18 DIAGNOSIS — I200 Unstable angina: Secondary | ICD-10-CM | POA: Diagnosis not present

## 2019-10-18 DIAGNOSIS — Z7984 Long term (current) use of oral hypoglycemic drugs: Secondary | ICD-10-CM | POA: Diagnosis not present

## 2019-10-18 DIAGNOSIS — Z87891 Personal history of nicotine dependence: Secondary | ICD-10-CM | POA: Diagnosis not present

## 2019-10-18 DIAGNOSIS — E785 Hyperlipidemia, unspecified: Secondary | ICD-10-CM | POA: Insufficient documentation

## 2019-10-18 DIAGNOSIS — Z79899 Other long term (current) drug therapy: Secondary | ICD-10-CM | POA: Insufficient documentation

## 2019-10-18 DIAGNOSIS — R079 Chest pain, unspecified: Secondary | ICD-10-CM | POA: Diagnosis not present

## 2019-10-18 DIAGNOSIS — Z8249 Family history of ischemic heart disease and other diseases of the circulatory system: Secondary | ICD-10-CM | POA: Diagnosis not present

## 2019-10-18 DIAGNOSIS — I25110 Atherosclerotic heart disease of native coronary artery with unstable angina pectoris: Secondary | ICD-10-CM | POA: Diagnosis not present

## 2019-10-18 DIAGNOSIS — E119 Type 2 diabetes mellitus without complications: Secondary | ICD-10-CM | POA: Insufficient documentation

## 2019-10-18 DIAGNOSIS — I2510 Atherosclerotic heart disease of native coronary artery without angina pectoris: Secondary | ICD-10-CM

## 2019-10-18 HISTORY — PX: LEFT HEART CATH AND CORONARY ANGIOGRAPHY: CATH118249

## 2019-10-18 HISTORY — PX: INTRAVASCULAR ULTRASOUND/IVUS: CATH118244

## 2019-10-18 LAB — CBC
HCT: 41.3 % (ref 39.0–52.0)
Hemoglobin: 12.9 g/dL — ABNORMAL LOW (ref 13.0–17.0)
MCH: 25.7 pg — ABNORMAL LOW (ref 26.0–34.0)
MCHC: 31.2 g/dL (ref 30.0–36.0)
MCV: 82.3 fL (ref 80.0–100.0)
Platelets: 260 10*3/uL (ref 150–400)
RBC: 5.02 MIL/uL (ref 4.22–5.81)
RDW: 13.6 % (ref 11.5–15.5)
WBC: 7 10*3/uL (ref 4.0–10.5)
nRBC: 0 % (ref 0.0–0.2)

## 2019-10-18 LAB — GLUCOSE, CAPILLARY: Glucose-Capillary: 121 mg/dL — ABNORMAL HIGH (ref 70–99)

## 2019-10-18 LAB — BASIC METABOLIC PANEL
Anion gap: 10 (ref 5–15)
BUN: 23 mg/dL — ABNORMAL HIGH (ref 6–20)
CO2: 25 mmol/L (ref 22–32)
Calcium: 9.5 mg/dL (ref 8.9–10.3)
Chloride: 103 mmol/L (ref 98–111)
Creatinine, Ser: 1.43 mg/dL — ABNORMAL HIGH (ref 0.61–1.24)
GFR calc Af Amer: >60 mL/min (ref >60)
GFR calc non Af Amer: 60 mL/min — ABNORMAL LOW (ref >60)
Glucose, Bld: 122 mg/dL — ABNORMAL HIGH (ref 70–99)
Potassium: 4.5 mmol/L (ref 3.5–5.1)
Sodium: 138 mmol/L (ref 135–145)

## 2019-10-18 LAB — POCT ACTIVATED CLOTTING TIME: Activated Clotting Time: 252 seconds

## 2019-10-18 SURGERY — LEFT HEART CATH AND CORONARY ANGIOGRAPHY
Anesthesia: LOCAL

## 2019-10-18 MED ORDER — EZETIMIBE 10 MG PO TABS: 10.0000 mg | ORAL_TABLET | Freq: Every day | ORAL | 2 refills | Status: DC

## 2019-10-18 MED ORDER — HEPARIN (PORCINE) IN NACL 1000-0.9 UT/500ML-% IV SOLN
INTRAVENOUS | Status: DC | PRN
  Administered 2019-10-18 (×2): 500 mL

## 2019-10-18 MED ORDER — METOPROLOL SUCCINATE ER 50 MG PO TB24: 50.0000 mg | ORAL_TABLET | Freq: Every day | ORAL | 1 refills | Status: DC

## 2019-10-18 MED ORDER — HEPARIN SODIUM (PORCINE) 1000 UNIT/ML IJ SOLN
INTRAMUSCULAR | Status: AC
  Filled 2019-10-18: qty 1

## 2019-10-18 MED ORDER — HEPARIN (PORCINE) IN NACL 1000-0.9 UT/500ML-% IV SOLN
INTRAVENOUS | Status: AC
  Filled 2019-10-18: qty 500

## 2019-10-18 MED ORDER — SODIUM CHLORIDE 0.9% FLUSH: 3.0000 mL | Freq: Two times a day (BID) | INTRAVENOUS | Status: DC

## 2019-10-18 MED ORDER — SODIUM CHLORIDE 0.9 % WEIGHT BASED INFUSION: 1.0000 mL/kg/h | INTRAVENOUS | Status: DC

## 2019-10-18 MED ORDER — SODIUM CHLORIDE 0.9 % IV SOLN: 250.0000 mL | INTRAVENOUS | Status: DC | PRN

## 2019-10-18 MED ORDER — VERAPAMIL HCL 2.5 MG/ML IV SOLN
INTRAVENOUS | Status: AC
  Filled 2019-10-18: qty 2

## 2019-10-18 MED ORDER — MIDAZOLAM HCL 2 MG/2ML IJ SOLN
INTRAMUSCULAR | Status: DC | PRN
  Administered 2019-10-18 (×2): 1 mg via INTRAVENOUS

## 2019-10-18 MED ORDER — SODIUM CHLORIDE 0.9 % WEIGHT BASED INFUSION
3.0000 mL/kg/h | INTRAVENOUS | Status: AC
  Administered 2019-10-18: 3 mL/kg/h via INTRAVENOUS

## 2019-10-18 MED ORDER — SODIUM CHLORIDE 0.9% FLUSH: 3.0000 mL | INTRAVENOUS | Status: DC | PRN

## 2019-10-18 MED ORDER — HEPARIN SODIUM (PORCINE) 1000 UNIT/ML IJ SOLN
INTRAMUSCULAR | Status: DC | PRN
  Administered 2019-10-18: 5000 [IU] via INTRAVENOUS
  Administered 2019-10-18: 3000 [IU] via INTRAVENOUS

## 2019-10-18 MED ORDER — ASPIRIN 81 MG PO CHEW: 81.0000 mg | CHEWABLE_TABLET | ORAL | Status: DC

## 2019-10-18 MED ORDER — ROSUVASTATIN CALCIUM 20 MG PO TABS
20.0000 mg | ORAL_TABLET | Freq: Every day | ORAL | 3 refills | Status: DC
Start: 2019-10-18 — End: 2019-12-17

## 2019-10-18 MED ORDER — VERAPAMIL HCL 2.5 MG/ML IV SOLN
INTRAVENOUS | Status: DC | PRN
  Administered 2019-10-18: 10 mL via INTRA_ARTERIAL

## 2019-10-18 MED ORDER — PRASUGREL HCL 10 MG PO TABS: 10.0000 mg | ORAL_TABLET | Freq: Every day | ORAL | 2 refills | Status: DC

## 2019-10-18 MED ORDER — ACETAMINOPHEN 325 MG PO TABS: 650.0000 mg | ORAL_TABLET | ORAL | Status: DC | PRN

## 2019-10-18 MED ORDER — MIDAZOLAM HCL 2 MG/2ML IJ SOLN
INTRAMUSCULAR | Status: AC
  Filled 2019-10-18: qty 2

## 2019-10-18 MED ORDER — LIDOCAINE HCL (PF) 1 % IJ SOLN
INTRAMUSCULAR | Status: DC | PRN
  Administered 2019-10-18: 2 mL

## 2019-10-18 MED ORDER — LIDOCAINE HCL (PF) 1 % IJ SOLN
INTRAMUSCULAR | Status: AC
  Filled 2019-10-18: qty 30

## 2019-10-18 MED ORDER — ONDANSETRON HCL 4 MG/2ML IJ SOLN: 4.0000 mg | Freq: Four times a day (QID) | INTRAMUSCULAR | Status: DC | PRN

## 2019-10-18 MED ORDER — FENTANYL CITRATE (PF) 100 MCG/2ML IJ SOLN
INTRAMUSCULAR | Status: DC | PRN
  Administered 2019-10-18: 50 ug via INTRAVENOUS

## 2019-10-18 MED ORDER — IOHEXOL 350 MG/ML SOLN
INTRAVENOUS | Status: DC | PRN
  Administered 2019-10-18: 70 mL

## 2019-10-18 MED ORDER — FENTANYL CITRATE (PF) 100 MCG/2ML IJ SOLN
INTRAMUSCULAR | Status: AC
  Filled 2019-10-18: qty 2

## 2019-10-18 MED FILL — PRASUGREL HCL 10 MG TABS: 10 | 30 days supply | Qty: 30 | Fill #0

## 2019-10-18 SURGICAL SUPPLY — 13 items
CATH OPTICROSS HD (CATHETERS) ×2 IMPLANT
CATH OPTITORQUE TIG 4.0 5F (CATHETERS) ×2 IMPLANT
CATH VISTA GUIDE 6FR XBLAD3.5 (CATHETERS) ×2 IMPLANT
DEVICE RAD COMP TR BAND LRG (VASCULAR PRODUCTS) ×2 IMPLANT
GLIDESHEATH SLEND A-KIT 6F 22G (SHEATH) ×2 IMPLANT
GUIDEWIRE INQWIRE 1.5J.035X260 (WIRE) ×2 IMPLANT
INQWIRE 1.5J .035X260CM (WIRE) ×4
KIT HEART LEFT (KITS) ×2 IMPLANT
PACK CARDIAC CATHETERIZATION (CUSTOM PROCEDURE TRAY) ×2 IMPLANT
SLED PULL BACK IVUS (MISCELLANEOUS) ×2 IMPLANT
TRANSDUCER W/STOPCOCK (MISCELLANEOUS) ×2 IMPLANT
TUBING CIL FLEX 10 FLL-RA (TUBING) ×2 IMPLANT
WIRE COUGAR XT STRL 190CM (WIRE) ×2 IMPLANT

## 2019-10-18 NOTE — Discharge Instructions (Signed)
Radial Site Care  This sheet gives you information about how to care for yourself after your procedure. Your health care provider may also give you more specific instructions. If you have problems or questions, contact your health care provider. What can I expect after the procedure? After the procedure, it is common to have:  Bruising and tenderness at the catheter insertion area. Follow these instructions at home: Medicines  Take over-the-counter and prescription medicines only as told by your health care provider. Insertion site care  Follow instructions from your health care provider about how to take care of your insertion site. Make sure you: ? Wash your hands with soap and water before you change your bandage (dressing). If soap and water are not available, use hand sanitizer. ? Change your dressing as told by your health care provider. ? Leave stitches (sutures), skin glue, or adhesive strips in place. These skin closures may need to stay in place for 2 weeks or longer. If adhesive strip edges start to loosen and curl up, you may trim the loose edges. Do not remove adhesive strips completely unless your health care provider tells you to do that.  Check your insertion site every day for signs of infection. Check for: ? Redness, swelling, or pain. ? Fluid or blood. ? Pus or a bad smell. ? Warmth.  Do not take baths, swim, or use a hot tub until your health care provider approves.  You may shower 24-48 hours after the procedure, or as directed by your health care provider. ? Remove the dressing and gently wash the site with plain soap and water. ? Pat the area dry with a clean towel. ? Do not rub the site. That could cause bleeding.  Do not apply powder or lotion to the site. Activity   For 24 hours after the procedure, or as directed by your health care provider: ? Do not flex or bend the affected arm. ? Do not push or pull heavy objects with the affected arm. ? Do not  drive yourself home from the hospital or clinic. You may drive 24 hours after the procedure unless your health care provider tells you not to. ? Do not operate machinery or power tools.  Do not lift anything that is heavier than 10 lb (4.5 kg), or the limit that you are told, until your health care provider says that it is safe.  Ask your health care provider when it is okay to: ? Return to work or school. ? Resume usual physical activities or sports. ? Resume sexual activity. General instructions  If the catheter site starts to bleed, raise your arm and put firm pressure on the site. If the bleeding does not stop, get help right away. This is a medical emergency.  If you went home on the same day as your procedure, a responsible adult should be with you for the first 24 hours after you arrive home.  Keep all follow-up visits as told by your health care provider. This is important. Contact a health care provider if:  You have a fever.  You have redness, swelling, or yellow drainage around your insertion site. Get help right away if:  You have unusual pain at the radial site.  The catheter insertion area swells very fast.  The insertion area is bleeding, and the bleeding does not stop when you hold steady pressure on the area.  Your arm or hand becomes pale, cool, tingly, or numb. These symptoms may represent a serious problem   that is an emergency. Do not wait to see if the symptoms will go away. Get medical help right away. Call your local emergency services (911 in the [place] Do not drive yourself to the hospital. Summary  After the procedure, it is common to have bruising and tenderness at the site.  Follow instructions from your health care provider about how to take care of your radial site wound. Check the wound every day for signs of infection.  Do not lift anything that is heavier than 10 lb (4.5 kg), or the limit that you are told, until your health care provider says  that it is safe. This information is not intended to replace advice given to you by your health care provider. Make sure you discuss any questions you have with your health care provider. Document Revised: 05/11/2017 Document Reviewed: 05/11/2017 Elsevier Patient Education  2020 [employer]  

## 2019-10-18 NOTE — Interval H&P Note (Signed)
History and Physical Interval Note:  10/18/2019 12:07 PM  Philip [name]  has presented today for surgery, with the diagnosis of Chest Pain.  The various methods of treatment have been discussed with the patient and family. After consideration of risks, benefits and other options for treatment, the patient has consented to  Procedure(s): LEFT HEART CATH AND CORONARY ANGIOGRAPHY (N/A) and possible angioplasty as a surgical intervention.  The patient's history has been reviewed, patient examined, no change in status, stable for surgery.  I have reviewed the patient's chart and labs.  Questions were answered to the patient's satisfaction.   Cath Lab Visit (complete for each Cath Lab visit)  Clinical Evaluation Leading to the Procedure:   ACS: Yes.    Non-ACS:    Anginal Classification: CCS IV  Anti-ischemic medical therapy: Maximal Therapy (2 or more classes of medications)  Non-Invasive Test Results: No non-invasive testing performed  Prior CABG: No previous CABG   Yates Decamp

## 2019-10-18 NOTE — Progress Notes (Signed)
I met the patient, discussed with him regarding his symptoms.  In spite of aggressive medical therapy, patient continues to have chest pain, in fact 2 nights ago he had severe chest pain had not used 2 sublingual nitroglycerin.  This morning he also had chest pressure but not enough to take a nitroglycerin.  He has been pretty much under bedrest since he last saw was on 10/16/2019.  Discussed the procedure with the patient, is willing to proceed.   Adrian Prows, MD, [facility] 10/18/2019, 12:10 PM Office: [phone]

## 2019-10-18 NOTE — Progress Notes (Addendum)
Discharge instructions reviewed with pt and his wife (via telephone) both voice understanding. Ambulated in hallway tol well 

## 2019-10-19 ENCOUNTER — Encounter (HOSPITAL_COMMUNITY): Payer: Self-pay | Admitting: Cardiology

## 2019-10-22 DIAGNOSIS — I25110 Atherosclerotic heart disease of native coronary artery with unstable angina pectoris: Secondary | ICD-10-CM | POA: Diagnosis not present

## 2019-10-22 DIAGNOSIS — E1169 Type 2 diabetes mellitus with other specified complication: Secondary | ICD-10-CM | POA: Diagnosis not present

## 2019-10-29 ENCOUNTER — Ambulatory Visit: Payer: BC Managed Care – PPO | Admitting: Cardiology

## 2019-10-29 ENCOUNTER — Encounter: Payer: Self-pay | Admitting: Cardiology

## 2019-10-29 ENCOUNTER — Other Ambulatory Visit: Payer: Self-pay

## 2019-10-29 VITALS — BP 97/62 | HR 76 | Resp 15 | Ht 72.0 in | Wt 212.0 lb

## 2019-10-29 DIAGNOSIS — I10 Essential (primary) hypertension: Secondary | ICD-10-CM | POA: Diagnosis not present

## 2019-10-29 DIAGNOSIS — I200 Unstable angina: Secondary | ICD-10-CM

## 2019-10-29 DIAGNOSIS — E782 Mixed hyperlipidemia: Secondary | ICD-10-CM

## 2019-10-29 DIAGNOSIS — I25118 Atherosclerotic heart disease of native coronary artery with other forms of angina pectoris: Secondary | ICD-10-CM

## 2019-10-29 MED ORDER — ASPIRIN 81 MG PO CHEW: 81.0000 mg | CHEWABLE_TABLET | Freq: Every day | ORAL | 2 refills | Status: DC

## 2019-10-29 NOTE — Progress Notes (Signed)
Primary Physician/Referring:  Iona Beard, MD  Patient ID: Philip Philip [name], male    DOB: April 16, 1976, 44 y.o.   MRN: [id number]  Chief Complaint  Patient presents with  . Follow-up    2 week  . Post Cath  . Coronary Artery Disease   HPI:   Visit date not found  Philip Philip [name]  Philip [name] a 44 y.o. [name] male with hypertension, diabetes melitus, strong family history of early Philip Philip [name] father having had MI l in his 61s, with chest pain. seen by [place] on 10/16/2019 for symptoms suggestive of unstable angina pectoris.  He was recommended cardiac catheterization which he underwent on 10/18/2019 and had moderate disease in the proximal LAD and recommended medical therapy.  He has had occasional episodes of angina for which he has taken sublingual nitroglycerin with relief.  But main complaint today Philip [name] also marked dizziness especially when he stands up.  He has not had any syncope.  Past Medical History:  Diagnosis Date  . Chest pain 2011  . Diabetes mellitus   . Hyperlipidemia   . Hypertension   . Reflux    Past Surgical History:  Procedure Laterality Date  . INTRAVASCULAR ULTRASOUND/IVUS N/A 10/18/2019   Procedure: Intravascular Ultrasound/IVUS;  Surgeon: Adrian Prows, MD;  Location: [facility];  Service: Cardiovascular;  Laterality: N/A;  . LEFT HEART CATH AND CORONARY ANGIOGRAPHY N/A 10/18/2019   Procedure: LEFT HEART CATH AND CORONARY ANGIOGRAPHY;  Surgeon: Adrian Prows, MD;  Location: [facility];  Service: Cardiovascular;  Laterality: N/A;   Family History  Problem Relation Age of Onset  . Hyperlipidemia Mother   . CAD Father        MI in 51s  . Hypertension Father   . Hypertension Sister   . Hypertension Brother     Social History   Tobacco Use  . Smoking status: Former Smoker    Packs/day: 0.50    Years: 15.00    Pack years: 7.50    Types: Cigarettes    Quit date: 2017    Years since quitting: 4.5  . Smokeless tobacco: Never Used  Substance Use  Topics  . Alcohol use: No   Marital Status: Married  ROS  Review of Systems  Cardiovascular: Positive for chest pain. Negative for dyspnea on exertion and leg swelling.  Gastrointestinal: Negative for melena.  Neurological: Positive for dizziness.   Objective  Blood pressure 97/62, pulse 76, resp. rate 15, height 6' (1.829 m), weight 212 lb (96.2 kg), SpO2 98 %.  Vitals with BMI 10/29/2019 10/18/2019 10/18/2019  Height 6' 0"  - -  Weight 212 lbs - -  BMI 47.42 - -  Systolic 97 - -  Diastolic 62 - -  Pulse 76 85 78     Physical Exam Cardiovascular:     Rate and Rhythm: Normal rate and regular rhythm.     Pulses: Intact distal pulses.     Heart sounds: Normal heart sounds. No murmur heard.  No gallop.      Comments: No leg edema, no JVD. Pulmonary:     Effort: Pulmonary effort Philip [name] normal.     Breath sounds: Normal breath sounds.  Abdominal:     General: Bowel sounds are normal.     Palpations: Abdomen Philip [name] soft.    Laboratory examination:   Recent Labs    10/18/19 1006  NA 138  K 4.5  CL 103  CO2 25  GLUCOSE 122*  BUN 23*  CREATININE 1.43*  CALCIUM 9.5  GFRNONAA 60*  GFRAA >60   estimated creatinine clearance Philip [name] 80.9 mL/min (A) (by C-G formula based on SCr of 1.43 mg/dL (H)).  CMP Latest Ref Rng & Units 10/18/2019 12/30/2011 11/07/2009  Glucose 70 - 99 mg/dL 122(H) 253(H) 128(H)  BUN 6 - 20 mg/dL 23(H) 9 10  Creatinine 0.61 - 1.24 mg/dL 1.43(H) 0.60 0.81  Sodium 135 - 145 mmol/L 138 135 138  Potassium 3.5 - 5.1 mmol/L 4.5 3.9 4.5  Chloride 98 - 111 mmol/L 103 96 105  CO2 22 - 32 mmol/L 25 24 28   Calcium 8.9 - 10.3 mg/dL 9.5 9.8 8.5  Total Protein 6.0 - 8.3 g/dL - 8.2 -  Total Bilirubin 0.3 - 1.2 mg/dL - 0.3 -  Alkaline Phos 39 - 117 U/L - 73 -  AST 0 - 37 U/L - 47(H) -  ALT 0 - 53 U/L - 61(H) -   CBC Latest Ref Rng & Units 10/18/2019 12/30/2011 11/07/2009  WBC 4.0 - 10.5 K/uL 7.0 10.1 10.0  Hemoglobin 13.0 - 17.0 g/dL 12.9(L) 15.2 13.1  Hematocrit 39 - 52 % 41.3  44.8 39.0  Platelets 150 - 400 K/uL 260 250 200   External labs:   10/11/2019: Glucose 113, BUN/Cr 24/1.56. EGFR 54. Na/K 136/5.3. Rest of the CMP normal  H/H 12.2/38.1. MCV 80.2. Platelets 278  HbA1C N/A, TSH N/A  Chol 146, TG 236, HDL 42, LDL 71   Trop I, BNP normal  Medications and allergies  No Known Allergies   Current Outpatient Medications  Medication Instructions  . aspirin (ASPIRIN CHILDRENS) 81 mg, Oral, Daily  . Co Q 10 100 mg, Oral, Daily  . cyclobenzaprine (FLEXERIL) 5 mg, Oral, Daily at bedtime  . ezetimibe (ZETIA) 10 mg, Oral, Daily after supper  . Garlic 264 mg, Oral, Daily  . meclizine (ANTIVERT) 25 mg, Oral, 3 times daily PRN  . metFORMIN (GLUCOPHAGE) 1,000 mg, Oral, 2 times daily with meals  . metoprolol succinate (TOPROL-XL) 50 mg, Oral, Daily at bedtime  . nitroGLYCERIN (NITROSTAT) 0.4 mg, Sublingual, Every 5 min PRN  . olmesartan-hydrochlorothiazide (BENICAR HCT) 40-12.5 MG tablet 0.5 tablets, Oral, BH-each morning  . omeprazole (PRILOSEC) 40 mg, Oral, Daily  . pioglitazone (ACTOS) 30 mg, Oral, Daily  . prasugrel (EFFIENT) 10 mg, Oral, Daily  . prednisoLONE acetate (PRED FORTE) 1 % ophthalmic suspension 1 drop, See admin instructions, Left ear daily  . rosuvastatin (CRESTOR) 20 mg, Oral, Daily  . zolpidem (AMBIEN CR) 6.25 mg, Oral, At bedtime PRN    Radiology:   No results found.  Cardiac Studies:   EKG 10/16/2019: Sinus rhythm 84 bpm Normal EKG    Echocardiogram 2011: - Left ventricle: The cavity size was normal. Wall thickness was   normal. Systolic function was normal. The estimated ejection   fraction was in the range of 60% to 65%. Wall motion was normal;   there were no regional wall motion abnormalities. Left ventricular   diastolic function parameters were normal.  - Right ventricle: The cavity size was normal. Systolic function was  normal.  - Pulmonary arteries: No TR doppler jet so unable to estimate PA systolic  pressure.    Left heart catheterization 10/18/2019: LV: Normal LV systolic function, normal LVEDP. Left main: Large vessel.  No significant disease. LAD: Very large vessel.  Proximal LAD has tandem 50 and a 40% stenosis followed by mild ectasia.  There Philip [name] dye hang-up noted in the proximal segment.  Minimal disease in the mid to  distal LAD.  Diagonal 2 Philip [name] small to medium sized vessel with a proximal 50% to 60% stenosis. Circumflex: Very large vessel with giving origin to small OM1 and continues as a large OM 2.  Between OM1 and OM 2 there Philip [name] a 30% luminal irregularity. Right coronary artery: Large vessel, dominant vessel.  No significant disease.  IVUS LAD: There Philip [name] moderate amount of soft plaque and very minimal calcification noted in the proximal LAD.  The vessel size was 4.7 x 5.0 mm and lumen area was 2.67 x 2.99 giving 6.28 millimeter2 lumen area.  Recommendation: Patient does not have any ulcerations or unstable plaque, significant amount of soft plaque Philip [name] evident that could potentially rupture, however the intima appears to be very intact.  Aggressive risk factor modification Philip [name] indicated.  We will discontinue simvastatin and switch him to Crestor, 20 mg daily along with Zetia 10 mg daily, increase metoprolol from 25 mg daily to 50 mg of succinate daily, I will also start him on Effient 10 mg daily (Insurance preferred) for at least 2 to 3 months along with aspirin.  EKG  EKG 10/18/2018: Normal sinus rhythm at rate of 81 bpm, normal axis, nonspecific ST-T abnormality.  No significant change from 10/16/2019.    Assessment     ICD-10-CM   1. Coronary artery disease of native artery of native heart with stable angina pectoris (HCC)  I25.118 aspirin (ASPIRIN CHILDRENS) 81 MG chewable tablet    CMP14+EGFR    LDL cholesterol, direct    Lipid Panel With LDL/HDL Ratio    Lipoprotein A (LPA)  2. Essential hypertension  I10   3. Mixed hyperlipidemia  E78.2 LDL cholesterol, direct    Lipid  Panel With LDL/HDL Ratio    Lipoprotein A (LPA)  4. Unstable angina (HCC)  I20.0      Medications Discontinued During This Encounter  Medication Reason  . Magnesium 500 MG CAPS Patient Preference  . isosorbide mononitrate (IMDUR) 30 MG 24 hr tablet Discontinued by provider  . aspirin (ASPIRIN CHILDRENS) 81 MG chewable tablet      Recommendations:   Visit date not found  Philip Philip [name]  Philip [name] a 44 y.o. [name] male with hypertension, diabetes melitus, strong family history of early [place] father having had MI l in his 74s, with chest pain. seen by [place] on 10/16/2019 for symptoms suggestive of unstable angina pectoris.  He was recommended cardiac catheterization which he underwent on 10/18/2019 and had moderate disease in the proximal LAD and recommended medical therapy.  Patient still having episodes of angina but easily relieved with sublingual nitroglycerin.  Advised to continue aggressive medical therapy for now, also suspect his marked dizziness Philip [name] related to low blood pressure as I had added high-dose beta-blocker therapy.  I will decrease the dose of Benicar HCT from 40/12.5 mg every day to 1/2 tablet daily, advised him to hold the medication for the next 3 days and to monitor his blood pressure and to completely hold the medication if blood pressure Philip [name] <110/70 mmHg.  May have to switch him to plain Benicar either 10 or 20 mg.  With regard to hyperlipidemia, I have changed his simvastatin to Crestor and added Zetia to bring his LDL down to the lowest possible levels, I will obtain lipid profile testing along with CMP and LPA in 1 month and of like to see him back then.    Adrian Prows, MD, [facility] 10/29/2019, 1:03 PM Office: [phone]

## 2019-10-30 DIAGNOSIS — E1169 Type 2 diabetes mellitus with other specified complication: Secondary | ICD-10-CM | POA: Diagnosis not present

## 2019-10-30 DIAGNOSIS — I25110 Atherosclerotic heart disease of native coronary artery with unstable angina pectoris: Secondary | ICD-10-CM | POA: Diagnosis not present

## 2019-11-13 DIAGNOSIS — H9042 Sensorineural hearing loss, unilateral, left ear, with unrestricted hearing on the contralateral side: Secondary | ICD-10-CM | POA: Diagnosis not present

## 2019-11-13 DIAGNOSIS — H9312 Tinnitus, left ear: Secondary | ICD-10-CM | POA: Diagnosis not present

## 2019-11-13 DIAGNOSIS — H6121 Impacted cerumen, right ear: Secondary | ICD-10-CM | POA: Diagnosis not present

## 2019-11-17 ENCOUNTER — Other Ambulatory Visit: Payer: Self-pay | Admitting: Cardiology

## 2019-11-17 DIAGNOSIS — I200 Unstable angina: Secondary | ICD-10-CM

## 2019-11-26 DIAGNOSIS — L0233 Carbuncle of buttock: Secondary | ICD-10-CM | POA: Diagnosis not present

## 2019-11-26 DIAGNOSIS — E1169 Type 2 diabetes mellitus with other specified complication: Secondary | ICD-10-CM | POA: Diagnosis not present

## 2019-11-27 DIAGNOSIS — L0233 Carbuncle of buttock: Secondary | ICD-10-CM | POA: Diagnosis not present

## 2019-12-03 DIAGNOSIS — L0231 Cutaneous abscess of buttock: Secondary | ICD-10-CM | POA: Diagnosis not present

## 2019-12-03 DIAGNOSIS — L0233 Carbuncle of buttock: Secondary | ICD-10-CM | POA: Diagnosis not present

## 2019-12-03 DIAGNOSIS — E1169 Type 2 diabetes mellitus with other specified complication: Secondary | ICD-10-CM | POA: Diagnosis not present

## 2019-12-17 ENCOUNTER — Ambulatory Visit: Payer: BC Managed Care – PPO | Admitting: Cardiology

## 2019-12-17 ENCOUNTER — Other Ambulatory Visit: Payer: Self-pay

## 2019-12-17 ENCOUNTER — Encounter: Payer: Self-pay | Admitting: Cardiology

## 2019-12-17 VITALS — BP 120/79 | HR 63 | Resp 16 | Ht 72.0 in | Wt 205.6 lb

## 2019-12-17 DIAGNOSIS — F411 Generalized anxiety disorder: Secondary | ICD-10-CM | POA: Diagnosis not present

## 2019-12-17 DIAGNOSIS — I10 Essential (primary) hypertension: Secondary | ICD-10-CM

## 2019-12-17 DIAGNOSIS — E782 Mixed hyperlipidemia: Secondary | ICD-10-CM | POA: Diagnosis not present

## 2019-12-17 DIAGNOSIS — I25118 Atherosclerotic heart disease of native coronary artery with other forms of angina pectoris: Secondary | ICD-10-CM | POA: Diagnosis not present

## 2019-12-17 MED ORDER — DULOXETINE HCL 30 MG PO CPEP: 30.0000 mg | ORAL_CAPSULE | Freq: Every day | ORAL | 3 refills | Status: DC

## 2019-12-17 MED ORDER — ROSUVASTATIN CALCIUM 20 MG PO TABS: 10.0000 mg | ORAL_TABLET | Freq: Every day | ORAL | 3 refills | Status: DC

## 2019-12-17 NOTE — Progress Notes (Signed)
Primary Physician/Referring:  Iona Beard, MD  Patient ID: Philip [name], male    DOB: 1975/08/07, 44 y.o.   MRN: [id number]  Chief Complaint  Patient presents with  . Coronary Artery Disease  . Chest Pain  . Follow-up    4 week   HPI:    Philip [name]  is a 44 y.o. [name] male with hypertension, diabetes melitus, strong family history of early [place] father having had MI l in his 50s, with chest pain. seen by [place] on 10/16/2019 for symptoms suggestive of unstable angina pectoris.  He was recommended cardiac catheterization which he underwent on 10/18/2019 and had moderate disease in the proximal LAD and recommended medical therapy.  He presents for a 6-week office visit and follow-up, he has not had chest pain suggestive of angina but he has had occasional episodes of sharp pains in the left side of the right side of the chest, he is also noticed marked generalized anxiety especially when he is with his kids shopping and in a closed environment, states that he feels he is going to have an MI and worries about his kids and his wife.  Gets extremely anxious, had to get out of the store.  He has come close to being nonfunctional especially around kids.  He does not have similar symptoms when he is by himself, continues to exercise regularly.  Symptoms of anxiety is associated with palpitations however on checking his pulse heart rate remains to be within normal limits.  Past Medical History:  Diagnosis Date  . Chest pain 2011  . Diabetes mellitus   . Hyperlipidemia   . Hypertension   . Reflux    Past Surgical History:  Procedure Laterality Date  . INTRAVASCULAR ULTRASOUND/IVUS N/A 10/18/2019   Procedure: Intravascular Ultrasound/IVUS;  Surgeon: Adrian Prows, MD;  Location: [facility];  Service: Cardiovascular;  Laterality: N/A;  . LEFT HEART CATH AND CORONARY ANGIOGRAPHY N/A 10/18/2019   Procedure: LEFT HEART CATH AND CORONARY ANGIOGRAPHY;  Surgeon: Adrian Prows,  MD;  Location: [facility];  Service: Cardiovascular;  Laterality: N/A;   Family History  Problem Relation Age of Onset  . Hyperlipidemia Mother   . CAD Father        MI in 89s  . Hypertension Father   . Hypertension Sister   . Hypertension Brother     Social History   Tobacco Use  . Smoking status: Former Smoker    Packs/day: 0.50    Years: 15.00    Pack years: 7.50    Types: Cigarettes    Quit date: 2017    Years since quitting: 4.6  . Smokeless tobacco: Never Used  Substance Use Topics  . Alcohol use: Yes    Comment: occasionally    Marital Status: Married  ROS  Review of Systems  Cardiovascular: Positive for chest pain and palpitations. Negative for dyspnea on exertion and leg swelling.  Gastrointestinal: Negative for melena.  Neurological: Negative for dizziness.  Psychiatric/Behavioral: The patient is nervous/anxious.    Objective  Blood pressure 120/79, pulse 63, resp. rate 16, height 6' (1.829 m), weight 205 lb 9.6 oz (93.3 kg), SpO2 99 %.  Vitals with BMI 12/17/2019 10/29/2019 10/18/2019  Height 6' 0"  6' 0"  -  Weight 205 lbs 10 oz 212 lbs -  BMI 02.58 52.77 -  Systolic 824 97 -  Diastolic 79 62 -  Pulse 63 76 85     Physical Exam Cardiovascular:  Rate and Rhythm: Normal rate and regular rhythm.     Pulses: Intact distal pulses.     Heart sounds: Normal heart sounds. No murmur heard.  No gallop.      Comments: No leg edema, no JVD. Pulmonary:     Effort: Pulmonary effort is normal.     Breath sounds: Normal breath sounds.  Abdominal:     General: Bowel sounds are normal.     Palpations: Abdomen is soft.    Laboratory examination:   Recent Labs    10/18/19 1006  NA 138  K 4.5  CL 103  CO2 25  GLUCOSE 122*  BUN 23*  CREATININE 1.43*  CALCIUM 9.5  GFRNONAA 60*  GFRAA >60   CrCl cannot be calculated (Patient's most recent lab result is older than the maximum 21 days allowed.).  CMP Latest Ref Rng & Units 10/18/2019 12/30/2011  11/07/2009  Glucose 70 - 99 mg/dL 122(H) 253(H) 128(H)  BUN 6 - 20 mg/dL 23(H) 9 10  Creatinine 0.61 - 1.24 mg/dL 1.43(H) 0.60 0.81  Sodium 135 - 145 mmol/L 138 135 138  Potassium 3.5 - 5.1 mmol/L 4.5 3.9 4.5  Chloride 98 - 111 mmol/L 103 96 105  CO2 22 - 32 mmol/L 25 24 28   Calcium 8.9 - 10.3 mg/dL 9.5 9.8 8.5  Total Protein 6.0 - 8.3 g/dL - 8.2 -  Total Bilirubin 0.3 - 1.2 mg/dL - 0.3 -  Alkaline Phos 39 - 117 U/L - 73 -  AST 0 - 37 U/L - 47(H) -  ALT 0 - 53 U/L - 61(H) -   CBC Latest Ref Rng & Units 10/18/2019 12/30/2011 11/07/2009  WBC 4.0 - 10.5 K/uL 7.0 10.1 10.0  Hemoglobin 13.0 - 17.0 g/dL 12.9(L) 15.2 13.1  Hematocrit 39 - 52 % 41.3 44.8 39.0  Platelets 150 - 400 K/uL 260 250 200   Lipid Panel No results for input(s): CHOL, TRIG, LDLCALC, VLDL, HDL, CHOLHDL, LDLDIRECT in the last 8760 hours.   External labs:   10/11/2019: Glucose 113, BUN/Cr 24/1.56. EGFR 54. Na/K 136/5.3. Rest of the CMP normal  H/H 12.2/38.1. MCV 80.2. Platelets 278  HbA1C N/A, TSH N/A  Chol 146, TG 236, HDL 42, LDL 71   Trop I, BNP normal  Medications and allergies  No Known Allergies   Current Outpatient Medications  Medication Instructions  . aspirin (ASPIRIN CHILDRENS) 81 mg, Oral, Daily  . Co Q 10 100 mg, Oral, Daily  . cyclobenzaprine (FLEXERIL) 5 mg, Oral, Daily at bedtime  . DULoxetine (CYMBALTA) 30 mg, Oral, Daily  . ezetimibe (ZETIA) 10 mg, Oral, Daily after supper  . metFORMIN (GLUCOPHAGE) 1,000 mg, Oral, 2 times daily with meals  . metoprolol succinate (TOPROL-XL) 50 mg, Oral, Daily at bedtime  . nitroGLYCERIN (NITROSTAT) 0.4 mg, Sublingual, Every 5 min PRN  . olmesartan (BENICAR) 5 mg, Oral, Daily  . omeprazole (PRILOSEC) 40 mg, Oral, Daily  . pioglitazone (ACTOS) 30 mg, Oral, Daily  . prasugrel (EFFIENT) 10 mg, Oral, Daily  . prednisoLONE acetate (PRED FORTE) 1 % ophthalmic suspension 1 drop, See admin instructions, Left ear daily  . rosuvastatin (CRESTOR) 10 mg, Oral, Daily    . zolpidem (AMBIEN CR) 6.25 mg, Oral, At bedtime PRN   Radiology:   No results found.  Cardiac Studies:    Echocardiogram 2011: - Left ventricle: The cavity size was normal. Wall thickness was   normal. Systolic function was normal. The estimated ejection   fraction was in the range of  60% to 65%. Wall motion was normal;   there were no regional wall motion abnormalities. Left ventricular   diastolic function parameters were normal.  - Right ventricle: The cavity size was normal. Systolic function was  normal.  - Pulmonary arteries: No TR doppler jet so unable to estimate PA systolic pressure.    Left heart catheterization 10/18/2019: LV: Normal LV systolic function, normal LVEDP. Left main: Large vessel.  No significant disease. LAD: Very large vessel.  Proximal LAD has tandem 50 and a 40% stenosis followed by mild ectasia.  There is dye hang-up noted in the proximal segment.  Minimal disease in the mid to distal LAD.  Diagonal 2 is small to medium sized vessel with a proximal 50% to 60% stenosis. Circumflex: Very large vessel with giving origin to small OM1 and continues as a large OM 2.  Between OM1 and OM 2 there is a 30% luminal irregularity. Right coronary artery: Large vessel, dominant vessel.  No significant disease.  IVUS LAD: There is moderate amount of soft plaque and very minimal calcification noted in the proximal LAD.  The vessel size was 4.7 x 5.0 mm and lumen area was 2.67 x 2.99 giving 6.28 millimeter2 lumen area.  Recommendation: Patient does not have any ulcerations or unstable plaque, significant amount of soft plaque is evident that could potentially rupture, however the intima appears to be very intact.  Aggressive risk factor modification is indicated.  We will discontinue simvastatin and switch him to Crestor, 20 mg daily along with Zetia 10 mg daily, increase metoprolol from 25 mg daily to 50 mg of succinate daily, I will also start him on Effient 10 mg  daily (Insurance preferred) for at least 2 to 3 months along with aspirin.  EKG  EKG 10/18/2018: Normal sinus rhythm at rate of 81 bpm, normal axis, nonspecific ST-T abnormality.  No significant change from 10/16/2019.    Assessment     ICD-10-CM   1. Coronary artery disease of native artery of native heart with stable angina pectoris (HCC)  I25.118 rosuvastatin (CRESTOR) 20 MG tablet  2. Essential hypertension  I10   3. Mixed hyperlipidemia  E78.2 rosuvastatin (CRESTOR) 20 MG tablet  4. Generalized anxiety disorder  F41.1 DULoxetine (CYMBALTA) 30 MG capsule     Medications Discontinued During This Encounter  Medication Reason  . Garlic 333 MG TABS Patient Preference  . meclizine (ANTIVERT) 25 MG tablet Discontinued by provider  . olmesartan-hydrochlorothiazide (BENICAR HCT) 40-12.5 MG tablet Change in therapy  . rosuvastatin (CRESTOR) 20 MG tablet      Recommendations:   Visit date not found  Hymen Jamyson Jirak  is a 44 y.o. [name] male with hypertension, diabetes melitus, strong family history of early [place] father having had MI l in his 30s, with chest pain. seen by [place] on 10/16/2019 for symptoms suggestive of unstable angina pectoris.  He was recommended cardiac catheterization which he underwent on 10/18/2019 and had moderate disease in the proximal LAD and recommended medical therapy.  Symptoms of angina has resolved however he has occasional sharp pains which are clearly musculoskeletal.  He is now having generalized anxiety and panic disorder with the fear of having a acute coronary event especially when he is with his kids.  I have tried to reassure him, I will start him on duloxetine 30 mg daily.  Side effect of the medication discussed.  With regard to hyperlipidemia, he has been having myalgias with 20 mg of Crestor, will reduce it to 10  mg, continue Zetia as well.  We will obtain a lipid profile testing as a baseline today and will consider repeating this in 3 to  6 months again.  In view of mild generalized anxiety, I would like to see him back in 3 months for follow-up.  If he remains stable I will see him back in 6 months and then on annual basis.   Adrian Prows, MD, [facility] 12/17/2019, 9:26 AM Office: [phone]

## 2019-12-18 ENCOUNTER — Other Ambulatory Visit: Payer: Self-pay | Admitting: Cardiology

## 2019-12-18 DIAGNOSIS — T50905A Adverse effect of unspecified drugs, medicaments and biological substances, initial encounter: Secondary | ICD-10-CM

## 2019-12-18 DIAGNOSIS — I10 Essential (primary) hypertension: Secondary | ICD-10-CM

## 2019-12-18 DIAGNOSIS — E875 Hyperkalemia: Secondary | ICD-10-CM

## 2019-12-18 LAB — LIPID PANEL WITH LDL/HDL RATIO
Cholesterol, Total: 108 mg/dL (ref 100–199)
HDL: 39 mg/dL — ABNORMAL LOW (ref >39)
LDL Chol Calc (NIH): 41 mg/dL (ref 0–99)
LDL/HDL Ratio: 1.1 ratio (ref 0.0–3.6)
Triglycerides: 167 mg/dL — ABNORMAL HIGH (ref 0–149)
VLDL Cholesterol Cal: 28 mg/dL (ref 5–40)

## 2019-12-18 LAB — CMP14+EGFR
ALT: 17 IU/L (ref 0–44)
AST: 25 IU/L (ref 0–40)
Albumin/Globulin Ratio: 1.5 (ref 1.2–2.2)
Albumin: 4.9 g/dL (ref 4.0–5.0)
Alkaline Phosphatase: 71 IU/L (ref 48–121)
BUN/Creatinine Ratio: 8 — ABNORMAL LOW (ref 9–20)
BUN: 11 mg/dL (ref 6–24)
Bilirubin Total: 0.3 mg/dL (ref 0.0–1.2)
CO2: 23 mmol/L (ref 20–29)
Calcium: 9.9 mg/dL (ref 8.7–10.2)
Chloride: 102 mmol/L (ref 96–106)
Creatinine, Ser: 1.3 mg/dL — ABNORMAL HIGH (ref 0.76–1.27)
GFR calc Af Amer: 78 mL/min/{1.73_m2} (ref >59)
GFR calc non Af Amer: 67 mL/min/{1.73_m2} (ref >59)
Globulin, Total: 3.2 g/dL (ref 1.5–4.5)
Glucose: 98 mg/dL (ref 65–99)
Potassium: 5.3 mmol/L — ABNORMAL HIGH (ref 3.5–5.2)
Sodium: 139 mmol/L (ref 134–144)
Total Protein: 8.1 g/dL (ref 6.0–8.5)

## 2019-12-18 LAB — LDL CHOLESTEROL, DIRECT: LDL Direct: 45 mg/dL (ref 0–99)

## 2019-12-18 LAB — LIPOPROTEIN A (LPA): Lipoprotein (a): 19.9 nmol/L (ref <75.0)

## 2019-12-18 NOTE — Progress Notes (Signed)
10/11/2019: Chol 146, TG 236, HDL 42, LDL 71  Now LDL down to 41. TG down to 167. Can get better with avoiding saturated fat and excess starch.  Potassium level slightly up and would recommend recheck in 2 weeks, orders placed. If persistently high, discontinue Benicar 5 mg daily.  JG

## 2020-01-07 DIAGNOSIS — M25561 Pain in right knee: Secondary | ICD-10-CM | POA: Diagnosis not present

## 2020-01-09 ENCOUNTER — Other Ambulatory Visit: Payer: Self-pay | Admitting: Cardiology

## 2020-01-09 DIAGNOSIS — F411 Generalized anxiety disorder: Secondary | ICD-10-CM

## 2020-01-12 ENCOUNTER — Other Ambulatory Visit: Payer: Self-pay | Admitting: Cardiology

## 2020-01-12 DIAGNOSIS — I25118 Atherosclerotic heart disease of native coronary artery with other forms of angina pectoris: Secondary | ICD-10-CM

## 2020-01-25 DIAGNOSIS — M542 Cervicalgia: Secondary | ICD-10-CM | POA: Diagnosis not present

## 2020-01-25 DIAGNOSIS — M2428 Disorder of ligament, vertebrae: Secondary | ICD-10-CM | POA: Insufficient documentation

## 2020-01-30 DIAGNOSIS — M5030 Other cervical disc degeneration, unspecified cervical region: Secondary | ICD-10-CM | POA: Insufficient documentation

## 2020-02-05 DIAGNOSIS — M542 Cervicalgia: Secondary | ICD-10-CM | POA: Diagnosis not present

## 2020-02-15 DIAGNOSIS — M542 Cervicalgia: Secondary | ICD-10-CM | POA: Diagnosis not present

## 2020-02-18 DIAGNOSIS — L308 Other specified dermatitis: Secondary | ICD-10-CM | POA: Diagnosis not present

## 2020-02-18 DIAGNOSIS — L219 Seborrheic dermatitis, unspecified: Secondary | ICD-10-CM | POA: Diagnosis not present

## 2020-02-19 DIAGNOSIS — M542 Cervicalgia: Secondary | ICD-10-CM | POA: Diagnosis not present

## 2020-02-21 DIAGNOSIS — M5031 Other cervical disc degeneration,  high cervical region: Secondary | ICD-10-CM | POA: Diagnosis not present

## 2020-02-27 DIAGNOSIS — M542 Cervicalgia: Secondary | ICD-10-CM | POA: Diagnosis not present

## 2020-03-04 DIAGNOSIS — M542 Cervicalgia: Secondary | ICD-10-CM | POA: Diagnosis not present

## 2020-03-16 ENCOUNTER — Other Ambulatory Visit: Payer: Self-pay | Admitting: Cardiology

## 2020-03-16 DIAGNOSIS — E782 Mixed hyperlipidemia: Secondary | ICD-10-CM

## 2020-03-16 DIAGNOSIS — I25118 Atherosclerotic heart disease of native coronary artery with other forms of angina pectoris: Secondary | ICD-10-CM

## 2020-03-16 DIAGNOSIS — I200 Unstable angina: Secondary | ICD-10-CM

## 2020-03-19 DIAGNOSIS — M542 Cervicalgia: Secondary | ICD-10-CM | POA: Diagnosis not present

## 2020-03-20 ENCOUNTER — Other Ambulatory Visit: Payer: Self-pay

## 2020-03-20 ENCOUNTER — Ambulatory Visit: Payer: BC Managed Care – PPO | Admitting: Cardiology

## 2020-03-20 ENCOUNTER — Encounter: Payer: Self-pay | Admitting: Cardiology

## 2020-03-20 VITALS — BP 150/96 | HR 84 | Resp 16 | Ht 72.0 in | Wt 215.0 lb

## 2020-03-20 DIAGNOSIS — I10 Essential (primary) hypertension: Secondary | ICD-10-CM

## 2020-03-20 DIAGNOSIS — F411 Generalized anxiety disorder: Secondary | ICD-10-CM | POA: Diagnosis not present

## 2020-03-20 DIAGNOSIS — E782 Mixed hyperlipidemia: Secondary | ICD-10-CM

## 2020-03-20 DIAGNOSIS — I25118 Atherosclerotic heart disease of native coronary artery with other forms of angina pectoris: Secondary | ICD-10-CM | POA: Diagnosis not present

## 2020-03-20 MED ORDER — ISOSORBIDE MONONITRATE ER 30 MG PO TB24: 30.0000 mg | ORAL_TABLET | Freq: Every day | ORAL | 3 refills | Status: DC

## 2020-03-20 MED ORDER — OLMESARTAN MEDOXOMIL 20 MG PO TABS: 20.0000 mg | ORAL_TABLET | Freq: Every evening | ORAL | 3 refills | Status: DC

## 2020-03-20 MED ORDER — ROSUVASTATIN CALCIUM 10 MG PO TABS: 10.0000 mg | ORAL_TABLET | Freq: Every day | ORAL | 3 refills | Status: DC

## 2020-03-20 MED ORDER — BUPROPION HCL ER (XL) 150 MG PO TB24: 150.0000 mg | ORAL_TABLET | Freq: Every morning | ORAL | 2 refills | Status: DC

## 2020-03-20 NOTE — Progress Notes (Signed)
Primary Physician/Referring:  Iona Beard, MD  Patient ID: Philip [name], male    DOB: 1976-02-06, 44 y.o.   MRN: [id number]  Chief Complaint  Patient presents with  . Coronary Artery Disease  . Hyperlipidemia  . Follow-up    3 month   HPI:    Philip [name]  is a 44 y.o. [name] male with hypertension, diabetes melitus, strong family history of early [place] father having had MI l in his 54s, with chest pain. seen by [place] on 10/16/2019 for symptoms suggestive of unstable angina pectoris.  He was recommended cardiac catheterization which he underwent on 10/18/2019 and had moderate disease in the proximal LAD and recommended medical therapy.  He is here on a 40-monthoffice visit, fortunately has not had any further episodes of chest pain.  Since being on Cymbalta, states that all his anxiety and panic disorder has completely resolved but he has started to eat excessively and has gained about 2 to 3 pounds in weight.  He would like you to consider change of medication due to this.  States that since last office visit and being on medications, he has not had any further chest pain episodes.  Past Medical History:  Diagnosis Date  . Chest pain 2011  . Diabetes mellitus   . Hyperlipidemia   . Hypertension   . Reflux    Past Surgical History:  Procedure Laterality Date  . INTRAVASCULAR ULTRASOUND/IVUS N/A 10/18/2019   Procedure: Intravascular Ultrasound/IVUS;  Surgeon: GAdrian Prows MD;  [facility];  Service: Cardiovascular;  Laterality: N/A;  . LEFT HEART CATH AND CORONARY ANGIOGRAPHY N/A 10/18/2019   Procedure: LEFT HEART CATH AND CORONARY ANGIOGRAPHY;  Surgeon: GAdrian Prows MD;  [facility];  Service: Cardiovascular;  Laterality: N/A;   Family History  Problem Relation Age of Onset  . Hyperlipidemia Mother   . CAD Father        MI in 521s . Hypertension Father   . Hypertension Sister   . Hypertension Brother     Social History    Tobacco Use  . Smoking status: Former Smoker    Packs/day: 0.50    Years: 15.00    Pack years: 7.50    Types: Cigarettes    Quit date: 2017    Years since quitting: 4.9  . Smokeless tobacco: Never Used  Substance Use Topics  . Alcohol use: Yes    Comment: occasionally    Marital Status: Married  ROS  Review of Systems  Cardiovascular: Negative for chest pain, dyspnea on exertion, leg swelling and palpitations.  Gastrointestinal: Negative for melena.  Neurological: Negative for dizziness.  Psychiatric/Behavioral: The patient is nervous/anxious (Improved on Cymbalta).    Objective  Blood pressure 136/84, pulse 84, resp. rate 16, height 6' (1.829 m), weight 215 lb (97.5 kg), SpO2 99 %.  Vitals with BMI 03/20/2020 12/17/2019 10/29/2019  Height 6' 0"  6' 0"  6' 0"   Weight 215 lbs 205 lbs 10 oz 212 lbs  BMI 29.15 [id number] Systolic [id number]  Diastolic 84 79 62  Pulse 84 63 76     Physical Exam Cardiovascular:     Rate and Rhythm: Normal rate and regular rhythm.     Pulses: Intact distal pulses.     Heart sounds: Normal heart sounds. No murmur heard.  No gallop.      Comments: No leg edema, no JVD. Pulmonary:     Effort: Pulmonary effort is normal.  Breath sounds: Normal breath sounds.  Abdominal:     General: Bowel sounds are normal.     Palpations: Abdomen is soft.    Laboratory examination:   Recent Labs    10/18/19 1006 12/17/19 1029  NA 138 139  K 4.5 5.3*  CL 103 102  CO2 25 23  GLUCOSE 122* 98  BUN 23* 11  CREATININE 1.43* 1.30*  CALCIUM 9.5 9.9  GFRNONAA 60* 67  GFRAA >60 78   CrCl cannot be calculated (Patient's most recent lab result is older than the maximum 21 days allowed.).  CMP Latest Ref Rng & Units 12/17/2019 10/18/2019 12/30/2011  Glucose 65 - 99 mg/dL 98 122(H) 253(H)  BUN 6 - 24 mg/dL 11 23(H) 9  Creatinine 0.76 - 1.27 mg/dL 1.30(H) 1.43(H) 0.60  Sodium 134 - 144 mmol/L 139 138 135  Potassium 3.5 - 5.2 mmol/L 5.3(H) 4.5 3.9   Chloride 96 - 106 mmol/L 102 103 96  CO2 20 - 29 mmol/L 23 25 24   Calcium 8.7 - 10.2 mg/dL 9.9 9.5 9.8  Total Protein 6.0 - 8.5 g/dL 8.1 - 8.2  Total Bilirubin 0.0 - 1.2 mg/dL 0.3 - 0.3  Alkaline Phos 48 - 121 IU/L 71 - 73  AST 0 - 40 IU/L 25 - 47(H)  ALT 0 - 44 IU/L 17 - 61(H)   CBC Latest Ref Rng & Units 10/18/2019 12/30/2011 11/07/2009  WBC 4.0 - 10.5 K/uL 7.0 10.1 10.0  Hemoglobin 13.0 - 17.0 g/dL 12.9(L) 15.2 13.1  Hematocrit 39 - 52 % 41.3 44.8 39.0  Platelets 150 - 400 K/uL 260 250 200   Lipid Panel Recent Labs    12/17/19 1029  CHOL 108  TRIG 167*  LDLCALC 41  HDL 39*  LDLDIRECT 45     External labs:   10/11/2019: Glucose 113, BUN/Cr 24/1.56. EGFR 54. Na/K 136/5.3. Rest of the CMP normal  H/H 12.2/38.1. MCV 80.2. Platelets 278  HbA1C N/A, TSH N/A  Chol 146, TG 236, HDL 42, LDL 71   Trop I, BNP normal  Medications and allergies  No Known Allergies   Current Outpatient Medications  Medication Instructions  . Co Q 10 100 mg, Oral, Daily  . [facility] ASPIRIN ADULT LOW DOSE 81 MG chewable tablet CHEW 1 TABLET (81 MG TOTAL) BY MOUTH DAILY.  . cyclobenzaprine (FLEXERIL) 5 mg, Oral, Daily at bedtime  . DULoxetine (CYMBALTA) 30 MG capsule TAKE 1 CAPSULE BY MOUTH EVERY DAY  . ezetimibe (ZETIA) 10 mg, Oral, Daily after supper  . metFORMIN (GLUCOPHAGE) 1,000 mg, Oral, 2 times daily with meals  . metoprolol succinate (TOPROL-XL) 50 MG 24 hr tablet TAKE 1 TABLET BY MOUTH EVERYDAY AT BEDTIME  . nitroGLYCERIN (NITROSTAT) 0.4 mg, Sublingual, Every 5 min PRN  . olmesartan (BENICAR) 5 mg, Oral, Daily  . omeprazole (PRILOSEC) 40 mg, Oral, Daily  . pioglitazone (ACTOS) 30 mg, Oral, Daily  . prasugrel (EFFIENT) 10 mg, Oral, Daily  . prednisoLONE acetate (PRED FORTE) 1 % ophthalmic suspension 1 drop, See admin instructions, Left ear daily  . rosuvastatin (CRESTOR) 20 MG tablet TAKE 1 TABLET BY MOUTH EVERY DAY  . zolpidem (AMBIEN CR) 6.25 mg, Oral, At bedtime PRN   Radiology:    No results found.  Cardiac Studies:    Echocardiogram 2011: - Left ventricle: The cavity size was normal. Wall thickness was   normal. Systolic function was normal. The estimated ejection   fraction was in the range of 60% to 65%. Wall motion was normal;  there were no regional wall motion abnormalities. Left ventricular   diastolic function parameters were normal.  - Right ventricle: The cavity size was normal. Systolic function was  normal.  - Pulmonary arteries: No TR doppler jet so unable to estimate PA systolic pressure.    Left heart catheterization 10/18/2019: LV: Normal LV systolic function, normal LVEDP. Left main: Large vessel.  No significant disease. LAD: Very large vessel.  Proximal LAD has tandem 50 and a 40% stenosis followed by mild ectasia.  There is dye hang-up noted in the proximal segment.  Minimal disease in the mid to distal LAD.  Diagonal 2 is small to medium sized vessel with a proximal 50% to 60% stenosis. Circumflex: Very large vessel with giving origin to small OM1 and continues as a large OM 2.  Between OM1 and OM 2 there is a 30% luminal irregularity. Right coronary artery: Large vessel, dominant vessel.  No significant disease.  IVUS LAD: There is moderate amount of soft plaque and very minimal calcification noted in the proximal LAD.  The vessel size was 4.7 x 5.0 mm and lumen area was 2.67 x 2.99 giving 6.28 millimeter2 lumen area.  Recommendation: Patient does not have any ulcerations or unstable plaque, significant amount of soft plaque is evident that could potentially rupture, however the intima appears to be very intact.  Aggressive risk factor modification is indicated.  We will discontinue simvastatin and switch him to Crestor, 20 mg daily along with Zetia 10 mg daily, increase metoprolol from 25 mg daily to 50 mg of succinate daily, I will also start him on Effient 10 mg daily (Insurance preferred) for at least 2 to 3 months along with  aspirin.  EKG  EKG 03/20/2020: Normal sinus rhythm with rate of 78 bpm, normal axis.  No evidence of ischemia, normal EKG.    EKG 10/18/2018: Normal sinus rhythm at rate of 81 bpm, normal axis, nonspecific ST-T abnormality.  No significant change from 10/16/2019.    Assessment     ICD-10-CM   1. Essential hypertension  I10   2. Coronary artery disease of native artery of native heart with stable angina pectoris (HCC)  I25.118 EKG 12-Lead     There are no discontinued medications.   Recommendations:   Visit date not found  Derwin Kennon Encinas  is a 44 y.o. [name] male with hypertension, diabetes melitus, strong family history of early CAD with father having had MI l in his 42s, with chest pain. seen by [place] on 10/16/2019 for symptoms suggestive of unstable angina pectoris.  He was recommended cardiac catheterization which he underwent on 10/18/2019 and had moderate disease in the proximal LAD and recommended medical therapy.  Symptoms of angina has resolved, also since starting Cymbalta, his anxiety level has improved significantly, he has been able to continue with activities of daily living without having to rush home feeling extremely anxious.  However with Cymbalta he has been gaining weight and would like to change the medication.  I will discontinue this and after 1 week of weaning, he will start with Wellbutrin SR 24 hours, 1 pill once a day.  Blood pressure is markedly elevated today, will increase Benicar from 5 mg to 20 mg in the evening, continue isosorbide mononitrate that was dropped from his medication list, continue Crestor 10 mg, lipids controlled.     Adrian Prows, MD, [facility] 03/20/2020, 9:25 AM Office: [phone]

## 2020-03-25 DIAGNOSIS — M542 Cervicalgia: Secondary | ICD-10-CM | POA: Diagnosis not present

## 2020-04-28 DIAGNOSIS — E785 Hyperlipidemia, unspecified: Secondary | ICD-10-CM | POA: Diagnosis not present

## 2020-04-28 DIAGNOSIS — I10 Essential (primary) hypertension: Secondary | ICD-10-CM | POA: Diagnosis not present

## 2020-04-28 DIAGNOSIS — M4802 Spinal stenosis, cervical region: Secondary | ICD-10-CM | POA: Diagnosis not present

## 2020-04-28 DIAGNOSIS — E1169 Type 2 diabetes mellitus with other specified complication: Secondary | ICD-10-CM | POA: Diagnosis not present

## 2020-06-19 ENCOUNTER — Other Ambulatory Visit: Payer: Self-pay | Admitting: Cardiology

## 2020-06-19 DIAGNOSIS — I25118 Atherosclerotic heart disease of native coronary artery with other forms of angina pectoris: Secondary | ICD-10-CM

## 2020-07-28 DIAGNOSIS — E785 Hyperlipidemia, unspecified: Secondary | ICD-10-CM | POA: Diagnosis not present

## 2020-07-28 DIAGNOSIS — M4722 Other spondylosis with radiculopathy, cervical region: Secondary | ICD-10-CM | POA: Diagnosis not present

## 2020-07-28 DIAGNOSIS — E1169 Type 2 diabetes mellitus with other specified complication: Secondary | ICD-10-CM | POA: Diagnosis not present

## 2020-07-28 DIAGNOSIS — M4802 Spinal stenosis, cervical region: Secondary | ICD-10-CM | POA: Diagnosis not present

## 2020-07-28 DIAGNOSIS — I10 Essential (primary) hypertension: Secondary | ICD-10-CM | POA: Diagnosis not present

## 2020-09-18 ENCOUNTER — Ambulatory Visit: Payer: BC Managed Care – PPO | Admitting: Cardiology

## 2020-09-20 ENCOUNTER — Other Ambulatory Visit: Payer: Self-pay | Admitting: Cardiology

## 2020-09-20 DIAGNOSIS — F411 Generalized anxiety disorder: Secondary | ICD-10-CM

## 2020-10-12 ENCOUNTER — Other Ambulatory Visit: Payer: Self-pay | Admitting: Cardiology

## 2020-10-12 DIAGNOSIS — F411 Generalized anxiety disorder: Secondary | ICD-10-CM

## 2020-10-28 DIAGNOSIS — I10 Essential (primary) hypertension: Secondary | ICD-10-CM | POA: Diagnosis not present

## 2020-10-28 DIAGNOSIS — M4722 Other spondylosis with radiculopathy, cervical region: Secondary | ICD-10-CM | POA: Diagnosis not present

## 2020-10-28 DIAGNOSIS — E1169 Type 2 diabetes mellitus with other specified complication: Secondary | ICD-10-CM | POA: Diagnosis not present

## 2020-10-31 ENCOUNTER — Other Ambulatory Visit: Payer: Self-pay

## 2020-10-31 ENCOUNTER — Encounter: Payer: Self-pay | Admitting: Cardiology

## 2020-10-31 ENCOUNTER — Other Ambulatory Visit: Payer: Self-pay | Admitting: Family Medicine

## 2020-10-31 ENCOUNTER — Ambulatory Visit
Admission: RE | Admit: 2020-10-31 | Discharge: 2020-10-31 | Disposition: A | Discharge status: Home / Self Care | Payer: BC Managed Care – PPO | Source: Ambulatory Visit | Attending: Family Medicine | Admitting: Family Medicine

## 2020-10-31 ENCOUNTER — Ambulatory Visit: Payer: BC Managed Care – PPO | Admitting: Cardiology

## 2020-10-31 VITALS — BP 120/67 | HR 85 | Temp 97.3°F | Resp 17 | Ht 72.0 in | Wt 220.2 lb

## 2020-10-31 DIAGNOSIS — E782 Mixed hyperlipidemia: Secondary | ICD-10-CM

## 2020-10-31 DIAGNOSIS — M25561 Pain in right knee: Secondary | ICD-10-CM | POA: Diagnosis not present

## 2020-10-31 DIAGNOSIS — R52 Pain, unspecified: Secondary | ICD-10-CM

## 2020-10-31 DIAGNOSIS — E119 Type 2 diabetes mellitus without complications: Secondary | ICD-10-CM

## 2020-10-31 DIAGNOSIS — I25118 Atherosclerotic heart disease of native coronary artery with other forms of angina pectoris: Secondary | ICD-10-CM | POA: Diagnosis not present

## 2020-10-31 DIAGNOSIS — I10 Essential (primary) hypertension: Secondary | ICD-10-CM | POA: Diagnosis not present

## 2020-10-31 MED ORDER — METOPROLOL SUCCINATE ER 50 MG PO TB24: 50.0000 mg | ORAL_TABLET | Freq: Every day | ORAL | 3 refills | Status: DC

## 2020-10-31 MED ORDER — EMPAGLIFLOZIN 25 MG PO TABS: 25.0000 mg | ORAL_TABLET | Freq: Every day | ORAL | 2 refills | Status: DC

## 2020-10-31 NOTE — Progress Notes (Signed)
Primary Physician/Referring:  Iona Beard, MD  Patient ID: Philip [name], male    DOB: 1976/02/03, 45 y.o.   MRN: [id number]  Chief Complaint  Patient presents with   Coronary Artery Disease   Follow-up   Hypertension    6 month   HPI:    Philip [name]  is a 45 y.o. [name] male with hypertension, diabetes melitus, strong family history of early CAD with father having had MI l in his 36s, with chest pain. seen by [place] on 10/16/2019 for symptoms suggestive of unstable angina pectoris, cardiac catheterization on 10/18/2019 revealed moderate disease in the proximal LAD and recommended medical therapy.  Symptoms of angina has resolved on medical therapy.  He has resumed all his activities without limitations.  Past Medical History:  Diagnosis Date   Chest pain 2011   Diabetes mellitus    Hyperlipidemia    Hypertension    Reflux    Past Surgical History:  Procedure Laterality Date   INTRAVASCULAR ULTRASOUND/IVUS N/A 10/18/2019   Procedure: Intravascular Ultrasound/IVUS;  Surgeon: Adrian Prows, MD;  Location: [facility];  Service: Cardiovascular;  Laterality: N/A;   LEFT HEART CATH AND CORONARY ANGIOGRAPHY N/A 10/18/2019   Procedure: LEFT HEART CATH AND CORONARY ANGIOGRAPHY;  Surgeon: Adrian Prows, MD;  Location: [facility];  Service: Cardiovascular;  Laterality: N/A;   Family History  Problem Relation Age of Onset   Hyperlipidemia Mother    CAD Father        MI in 15s   Hypertension Father    Hypertension Sister    Hypertension Brother     Social History   Tobacco Use   Smoking status: Former    Packs/day: 0.50    Years: 15.00    Pack years: 7.50    Types: Cigarettes    Quit date: 2017    Years since quitting: 5.5   Smokeless tobacco: Never  Substance Use Topics   Alcohol use: Yes    Comment: occasionally    Marital Status: Married  ROS  Review of Systems  Cardiovascular:  Negative for chest pain, dyspnea on exertion and leg swelling.   Gastrointestinal:  Negative for melena.  Objective  Blood pressure 120/67, pulse 85, temperature (!) 97.3 F (36.3 C), temperature source Temporal, resp. rate 17, height 6' (1.829 m), weight 220 lb 3.2 oz (99.9 kg), SpO2 97 %.  Vitals with BMI 43/15/2022 03/20/2020 03/20/2020  Height _0  - _1   Weight 220 lbs 3 oz - 215 lbs  BMI 25.00 - 37.04  Systolic 888 916 945  Diastolic 67 96 84  Pulse 85 - 84     Physical Exam Cardiovascular:     Rate and Rhythm: Normal rate and regular rhythm.     Pulses: Intact distal pulses.     Heart sounds: Normal heart sounds. No murmur heard.   No gallop.     Comments: No leg edema, no JVD. Pulmonary:     Effort: Pulmonary effort is normal.     Breath sounds: Normal breath sounds.  Abdominal:     General: Bowel sounds are normal.     Palpations: Abdomen is soft.   Laboratory examination:   Recent Labs    12/17/19 1029  NA 139  K 5.3*  CL 102  CO2 23  GLUCOSE 98  BUN 11  CREATININE 1.30*  CALCIUM 9.9  GFRNONAA 67  GFRAA 78   CrCl cannot be calculated (Patient's most recent lab result is  older than the maximum 21 days allowed.).  CMP Latest Ref Rng & Units 12/17/2019 10/18/2019 12/30/2011  Glucose 65 - 99 mg/dL 98 122(H) 253(H)  BUN 6 - 24 mg/dL 11 23(H) 9  Creatinine 0.76 - 1.27 mg/dL 1.30(H) 1.43(H) 0.60  Sodium 134 - 144 mmol/L 139 138 135  Potassium 3.5 - 5.2 mmol/L 5.3(H) 4.5 3.9  Chloride 96 - 106 mmol/L 102 103 96  CO2 20 - 29 mmol/L _0 Calcium 8.7 - 10.2 mg/dL 9.9 9.5 9.8  Total Protein 6.0 - 8.5 g/dL 8.1 - 8.2  Total Bilirubin 0.0 - 1.2 mg/dL 0.3 - 0.3  Alkaline Phos 48 - 121 IU/L 71 - 73  AST 0 - 40 IU/L 25 - 47(H)  ALT 0 - 44 IU/L 17 - 61(H)   CBC Latest Ref Rng & Units 10/18/2019 12/30/2011 11/07/2009  WBC 4.0 - 10.5 K/uL 7.0 10.1 10.0  Hemoglobin 13.0 - 17.0 g/dL 12.9(L) 15.2 13.1  Hematocrit 39.0 - 52.0 % 41.3 44.8 39.0  Platelets 150 - 400 K/uL 260 250 200   Lipid Panel Recent Labs    12/17/19 1029  CHOL  108  TRIG 167*  LDLCALC 41  HDL 39*  LDLDIRECT 45     External labs:   Cholesterol, total 162.000 m 10/28/2020 HDL 38.000 mg 10/28/2020 LDL 85.000 mg 10/28/2020 Triglycerides 327.000 m 10/28/2020  A1C 6.800 % of 10/28/2020  Hemoglobin 11.700 g/d 11/26/2019  Creatinine, Serum 1.420 mg/ 10/28/2020 Potassium 5.100 mm 10/28/2020 ALT (SGPT) 21.000 U/L 10/28/2020  10/11/2019: Glucose 113, BUN/Cr 24/1.56. EGFR 54. Na/K 136/5.3. Rest of the CMP normal  H/H 12.2/38.1. MCV 80.2. Platelets 278  HbA1C N/A, TSH N/A  Chol 146, TG 236, HDL 42, LDL 71   Trop I, BNP normal  Medications and allergies  No Known Allergies   Current Outpatient Medications  Medication Instructions   buPROPion (WELLBUTRIN XL) 150 mg, Oral, Every morning   Co Q 10 100 mg, Oral, Daily   [employer] ASPIRIN ADULT LOW DOSE 81 MG chewable tablet CHEW 1 TABLET (81 MG TOTAL) BY MOUTH DAILY.   cyclobenzaprine (FLEXERIL) 5 mg, Oral, Daily at bedtime   empagliflozin (JARDIANCE) 25 mg, Oral, Daily before breakfast   ezetimibe (ZETIA) 10 mg, Oral, Daily after supper   isosorbide mononitrate (IMDUR) 30 mg, Oral, Daily   ketoconazole (NIZORAL) 2 % shampoo SMARTSIG:5 Milliliter(s) Topical 2-3 Times Weekly   metFORMIN (GLUCOPHAGE) 1,000 mg, Oral, 2 times daily with meals   metoprolol succinate (TOPROL-XL) 50 mg, Oral, Daily, Take with or immediately following a meal.   nitroGLYCERIN (NITROSTAT) 0.4 mg, Sublingual, Every 5 min PRN   olmesartan (BENICAR) 20 mg, Oral, Every evening   omeprazole (PRILOSEC) 40 mg, Oral, Daily   pioglitazone (ACTOS) 30 mg, Oral, Daily   prednisoLONE acetate (PRED FORTE) 1 % ophthalmic suspension 1 drop, See admin instructions, Left ear daily   rosuvastatin (CRESTOR) 10 mg, Oral, Daily   zolpidem (AMBIEN CR) 6.25 mg, Oral, At bedtime PRN   Radiology:   No results found.  Cardiac Studies:    Echocardiogram 2011:  - Left ventricle: The cavity size was normal. Wall thickness was     normal. Systolic  function was normal. The estimated ejection     fraction was in the range of 60% to 65%. Wall motion was normal;     there were no regional wall motion abnormalities. Left ventricular     diastolic function parameters were normal.   - Right ventricle: The cavity size  was normal. Systolic function was  normal.   - Pulmonary arteries: No TR doppler jet so unable to estimate PA systolic pressure.    Left heart catheterization 10/18/2019: LV: Normal LV systolic function, normal LVEDP. Left main: Large vessel.  No significant disease. LAD: Very large vessel.  Proximal LAD has tandem 50 and a 40% stenosis followed by mild ectasia.  There is dye hang-up noted in the proximal segment.  Minimal disease in the mid to distal LAD.  Diagonal 2 is small to medium sized vessel with a proximal 50% to 60% stenosis. Circumflex: Very large vessel with giving origin to small OM1 and continues as a large OM 2.  Between OM1 and OM 2 there is a 30% luminal irregularity. Right coronary artery: Large vessel, dominant vessel.  No significant disease.   IVUS LAD: There is moderate amount of soft plaque and very minimal calcification noted in the proximal LAD.  The vessel size was 4.7 x 5.0 mm and lumen area was 2.67 x 2.99 giving 6.28 millimeter2 lumen area.   Recommendation: Patient does not have any ulcerations or unstable plaque, significant amount of soft plaque is evident that could potentially rupture, however the intima appears to be very intact.  Aggressive risk factor modification is indicated.  We will discontinue simvastatin and switch him to Crestor, 20 mg daily along with Zetia 10 mg daily, increase metoprolol from 25 mg daily to 50 mg of succinate daily, I will also start him on Effient 10 mg daily (Insurance preferred) for at least 2 to 3 months along with aspirin.  EKG  EKG 10/31/2020: Normal sinus rhythm at rate of 76 bpm, normal axis.  No evidence of ischemia, normal EKG.    EKG 03/20/2020: Normal sinus  rhythm with rate of 78 bpm, normal axis.  No evidence of ischemia, normal EKG.    EKG 10/18/2018: Normal sinus rhythm at rate of 81 bpm, normal axis, nonspecific ST-T abnormality.  No significant change from 10/16/2019.    Assessment     ICD-10-CM   1. Coronary artery disease of native artery of native heart with stable angina pectoris (HCC)  I25.118 metoprolol succinate (TOPROL-XL) 50 MG 24 hr tablet    2. Essential hypertension  I10 EKG 12-Lead    3. Mixed hyperlipidemia  E78.2     4. Controlled type 2 diabetes mellitus without complication, without long-term current use of insulin (HCC)  E11.9 empagliflozin (JARDIANCE) 25 MG TABS tablet     Medications Discontinued During This Encounter  Medication Reason   metoprolol succinate (TOPROL-XL) 50 MG 24 hr tablet Reorder    Recommendations:   Visit date not found  Philip [name]  is a 45 y.o. [name] male with hypertension, diabetes melitus, strong family history of early CAD with father having had MI l in his 9s, with chest pain. seen by [place] on 10/16/2019 for symptoms suggestive of unstable angina pectoris, cardiac catheterization on 10/18/2019 revealed moderate disease in the proximal LAD and recommended medical therapy.  Symptoms of angina has resolved on medical therapy.  He has resumed all his activities without limitations.  He remains asymptomatic, blood pressure is well controlled.  Diabetes is also well controlled.  Unfortunately his LDL has risen and triglycerides have also risen with weight gain.  Discussed primary prevention again with the patient.  I would like to add Jardiance 25 mg daily both for diabetes, CAD and for weight loss.  We will follow-up with his PCP for further management of his diabetes,  I will see him back in a year for follow-up.     Adrian Prows, MD, [facility] 11/01/2020, 9:09 AM Office: [phone]

## 2020-11-07 NOTE — Telephone Encounter (Signed)
From pt

## 2020-11-19 ENCOUNTER — Other Ambulatory Visit: Payer: Self-pay | Admitting: Cardiology

## 2020-11-19 DIAGNOSIS — F411 Generalized anxiety disorder: Secondary | ICD-10-CM

## 2020-11-20 ENCOUNTER — Other Ambulatory Visit: Payer: Self-pay | Admitting: Cardiology

## 2020-11-20 DIAGNOSIS — I25118 Atherosclerotic heart disease of native coronary artery with other forms of angina pectoris: Secondary | ICD-10-CM

## 2020-12-15 ENCOUNTER — Other Ambulatory Visit: Payer: Self-pay | Admitting: Cardiology

## 2020-12-15 DIAGNOSIS — I25118 Atherosclerotic heart disease of native coronary artery with other forms of angina pectoris: Secondary | ICD-10-CM

## 2020-12-15 DIAGNOSIS — H9313 Tinnitus, bilateral: Secondary | ICD-10-CM | POA: Diagnosis not present

## 2020-12-24 NOTE — Telephone Encounter (Signed)
From pt

## 2020-12-25 NOTE — Telephone Encounter (Signed)
From pt

## 2021-01-02 ENCOUNTER — Telehealth: Payer: Self-pay | Admitting: Cardiology

## 2021-01-07 ENCOUNTER — Telehealth: Payer: Self-pay | Admitting: Cardiology

## 2021-01-07 NOTE — Telephone Encounter (Signed)
Katie w/Rx  [facility] says pt prescription for bupropion 150 mg costs about $50, want to know if this prescription can be changed to mirtazipine. Phone # [phone].

## 2021-01-07 NOTE — Telephone Encounter (Signed)
I sent him [facility]. $10 a month

## 2021-01-07 NOTE — Telephone Encounter (Signed)
prescription for bupropion 150 mg costs about $50, want to know if this prescription can be changed to mirtazipine.

## 2021-01-13 DIAGNOSIS — M25561 Pain in right knee: Secondary | ICD-10-CM | POA: Insufficient documentation

## 2021-01-15 DIAGNOSIS — M25561 Pain in right knee: Secondary | ICD-10-CM | POA: Diagnosis not present

## 2021-01-15 DIAGNOSIS — M79672 Pain in left foot: Secondary | ICD-10-CM | POA: Diagnosis not present

## 2021-01-22 NOTE — Telephone Encounter (Signed)
complete

## 2021-01-28 ENCOUNTER — Other Ambulatory Visit: Payer: Self-pay | Admitting: Cardiology

## 2021-01-28 DIAGNOSIS — E119 Type 2 diabetes mellitus without complications: Secondary | ICD-10-CM

## 2021-02-02 DIAGNOSIS — K219 Gastro-esophageal reflux disease without esophagitis: Secondary | ICD-10-CM | POA: Diagnosis not present

## 2021-02-02 DIAGNOSIS — E1169 Type 2 diabetes mellitus with other specified complication: Secondary | ICD-10-CM | POA: Diagnosis not present

## 2021-02-02 DIAGNOSIS — I10 Essential (primary) hypertension: Secondary | ICD-10-CM | POA: Diagnosis not present

## 2021-02-05 NOTE — Telephone Encounter (Signed)
From pt

## 2021-02-08 ENCOUNTER — Other Ambulatory Visit: Payer: Self-pay | Admitting: Cardiology

## 2021-02-08 DIAGNOSIS — E782 Mixed hyperlipidemia: Secondary | ICD-10-CM

## 2021-02-08 DIAGNOSIS — I25118 Atherosclerotic heart disease of native coronary artery with other forms of angina pectoris: Secondary | ICD-10-CM

## 2021-02-08 DIAGNOSIS — I10 Essential (primary) hypertension: Secondary | ICD-10-CM

## 2021-02-20 DIAGNOSIS — E1169 Type 2 diabetes mellitus with other specified complication: Secondary | ICD-10-CM | POA: Diagnosis not present

## 2021-02-25 ENCOUNTER — Telehealth: Payer: Self-pay

## 2021-02-25 DIAGNOSIS — I25118 Atherosclerotic heart disease of native coronary artery with other forms of angina pectoris: Secondary | ICD-10-CM

## 2021-02-25 DIAGNOSIS — E119 Type 2 diabetes mellitus without complications: Secondary | ICD-10-CM

## 2021-02-25 MED ORDER — DAPAGLIFLOZIN PROPANEDIOL 10 MG PO TABS: 10.0000 mg | ORAL_TABLET | Freq: Every day | ORAL | 3 refills | Status: DC

## 2021-02-25 NOTE — Telephone Encounter (Signed)
ICD-10-CM   1. Controlled type 2 diabetes mellitus without complication, without long-term current use of insulin (HCC)  E11.9 dapagliflozin propanediol (FARXIGA) 10 MG TABS tablet    2. Coronary artery disease of native artery of native heart with stable angina pectoris (HCC)  I25.118 dapagliflozin propanediol (FARXIGA) 10 MG TABS tablet     Meds ordered this encounter  Medications   dapagliflozin propanediol (FARXIGA) 10 MG TABS tablet    Sig: Take 1 tablet (10 mg total) by mouth daily before breakfast.    Dispense:  90 tablet    Refill:  3    Medications Discontinued During This Encounter  Medication Reason   JARDIANCE 25 MG TABS tablet Not covered by the [employer] preference.   Yates Decamp, MD, [facility] 02/25/2021, 8:14 PM Office: [phone] Fax: [phone] Pager: [phone]

## 2021-03-09 DIAGNOSIS — Z23 Encounter for immunization: Secondary | ICD-10-CM | POA: Diagnosis not present

## 2021-03-09 DIAGNOSIS — L219 Seborrheic dermatitis, unspecified: Secondary | ICD-10-CM | POA: Diagnosis not present

## 2021-03-09 DIAGNOSIS — L308 Other specified dermatitis: Secondary | ICD-10-CM | POA: Diagnosis not present

## 2021-03-20 ENCOUNTER — Encounter: Payer: Self-pay | Admitting: Cardiology

## 2021-03-20 NOTE — Telephone Encounter (Signed)
From patient.

## 2021-04-24 ENCOUNTER — Other Ambulatory Visit: Payer: Self-pay | Admitting: Cardiology

## 2021-04-24 DIAGNOSIS — E119 Type 2 diabetes mellitus without complications: Secondary | ICD-10-CM

## 2021-07-12 ENCOUNTER — Other Ambulatory Visit: Payer: Self-pay | Admitting: Cardiology

## 2021-07-12 DIAGNOSIS — F411 Generalized anxiety disorder: Secondary | ICD-10-CM

## 2021-08-16 ENCOUNTER — Other Ambulatory Visit: Payer: Self-pay | Admitting: Cardiology

## 2021-08-16 DIAGNOSIS — I25118 Atherosclerotic heart disease of native coronary artery with other forms of angina pectoris: Secondary | ICD-10-CM

## 2021-08-24 ENCOUNTER — Encounter: Payer: Self-pay | Admitting: Cardiology

## 2021-08-24 DIAGNOSIS — E119 Type 2 diabetes mellitus without complications: Secondary | ICD-10-CM

## 2021-08-27 MED ORDER — EMPAGLIFLOZIN 25 MG PO TABS: 25.0000 mg | ORAL_TABLET | Freq: Every day | ORAL | 3 refills | Status: DC

## 2021-08-27 NOTE — Telephone Encounter (Signed)
ICD-10-CM   ?1. Controlled type 2 diabetes mellitus without complication, without long-term current use of insulin (HCC)  E11.9 empagliflozin (JARDIANCE) 25 MG TABS tablet  ?  ? ?Medications Discontinued During This Encounter  ?Medication Reason  ? dapagliflozin propanediol (FARXIGA) 10 MG TABS tablet P&T Policy: Therapeutic Substitute  ?  ?Meds ordered this encounter  ?Medications  ? empagliflozin (JARDIANCE) 25 MG TABS tablet  ?  Sig: Take 1 tablet (25 mg total) by mouth daily before breakfast.  ?  Dispense:  90 tablet  ?  Refill:  3  ?  ?

## 2021-08-27 NOTE — Telephone Encounter (Signed)
From patient.

## 2021-10-05 ENCOUNTER — Other Ambulatory Visit: Payer: Self-pay | Admitting: Cardiology

## 2021-10-05 DIAGNOSIS — E782 Mixed hyperlipidemia: Secondary | ICD-10-CM

## 2021-10-05 DIAGNOSIS — I25118 Atherosclerotic heart disease of native coronary artery with other forms of angina pectoris: Secondary | ICD-10-CM

## 2021-10-05 DIAGNOSIS — I10 Essential (primary) hypertension: Secondary | ICD-10-CM

## 2021-10-21 ENCOUNTER — Other Ambulatory Visit: Payer: Self-pay | Admitting: Cardiology

## 2021-10-21 DIAGNOSIS — I25118 Atherosclerotic heart disease of native coronary artery with other forms of angina pectoris: Secondary | ICD-10-CM

## 2021-10-30 ENCOUNTER — Ambulatory Visit: Payer: BC Managed Care – PPO | Admitting: Cardiology

## 2021-11-20 ENCOUNTER — Encounter: Payer: Self-pay | Admitting: Cardiology

## 2021-11-20 ENCOUNTER — Ambulatory Visit: Payer: No Typology Code available for payment source | Admitting: Cardiology

## 2021-11-20 VITALS — BP 104/64 | HR 90 | Temp 97.6°F | Resp 16 | Ht 72.0 in | Wt 194.0 lb

## 2021-11-20 DIAGNOSIS — I10 Essential (primary) hypertension: Secondary | ICD-10-CM

## 2021-11-20 DIAGNOSIS — N522 Drug-induced erectile dysfunction: Secondary | ICD-10-CM

## 2021-11-20 DIAGNOSIS — E782 Mixed hyperlipidemia: Secondary | ICD-10-CM

## 2021-11-20 DIAGNOSIS — I25118 Atherosclerotic heart disease of native coronary artery with other forms of angina pectoris: Secondary | ICD-10-CM

## 2021-11-20 MED ORDER — PROPRANOLOL HCL ER 60 MG PO CP24: 60.0000 mg | ORAL_CAPSULE | Freq: Every day | ORAL | 2 refills | Status: DC

## 2021-11-20 MED ORDER — SILDENAFIL CITRATE 100 MG PO TABS: 100.0000 mg | ORAL_TABLET | Freq: Every day | ORAL | 3 refills | Status: AC | PRN

## 2021-11-20 NOTE — Progress Notes (Signed)
Primary Physician/Referring:  Mirna Mires, MD  Patient ID: Philip [name], male    DOB: Dec 14, 1975, 46 y.o.   MRN: [id number]  Chief Complaint  Patient presents with   Coronary Artery Disease   Hyperlipidemia   Follow-up    1 year   HPI:    Philip [name]  is a 46 y.o. [place] American male with hypertension, diabetes melitus, strong family history of early CAD with father having had MI l in his 19s, with chest pain. seen by [place] on 10/16/2019 for symptoms suggestive of unstable angina pectoris, cardiac catheterization on 10/18/2019 revealed moderate disease in the proximal LAD and recommended medical therapy.  Symptoms of angina has resolved on medical therapy.  He has resumed all his activities without limitations.  He is still extremely anxious and would like me to order some imaging, advised him that we will perform a routine treadmill exercise stress test to reassure him.    Past Medical History:  Diagnosis Date   Chest pain 2011   Diabetes mellitus    Hyperlipidemia    Hypertension    Reflux    Past Surgical History:  Procedure Laterality Date   INTRAVASCULAR ULTRASOUND/IVUS N/A 10/18/2019   Procedure: Intravascular Ultrasound/IVUS;  Surgeon: Yates Decamp, MD;  Location: [facility];  Service: Cardiovascular;  Laterality: N/A;   LEFT HEART CATH AND CORONARY ANGIOGRAPHY N/A 10/18/2019   Procedure: LEFT HEART CATH AND CORONARY ANGIOGRAPHY;  Surgeon: Yates Decamp, MD;  Location: [facility];  Service: Cardiovascular;  Laterality: N/A;   Family History  Problem Relation Age of Onset   Hyperlipidemia Mother    CAD Father        MI in 79s   Hypertension Father    Hypertension Sister    Hypertension Brother     Social History   Tobacco Use   Smoking status: Former    Packs/day: 0.50    Years: 15.00    Total pack years: 7.50    Types: Cigarettes    Quit date: 2017    Years since quitting: 6.5   Smokeless tobacco: Never  Substance Use Topics   Alcohol  use: Yes    Comment: occasionally    Marital Status: Married  ROS  Review of Systems  Cardiovascular:  Negative for chest pain, dyspnea on exertion and leg swelling.  Gastrointestinal:  Negative for melena.   Objective  Blood pressure 104/64, pulse 90, temperature 97.6 F (36.4 C), resp. rate 16, height 6' (1.829 m), weight 194 lb (88 kg), SpO2 95 %.     11/20/2021   11:09 AM 10/31/2020   10:24 AM 03/20/2020    9:46 AM  Vitals with BMI  Height 6\' 0"  6\' 0"    Weight 194 lbs 220 lbs 3 oz   BMI 26.31 29.86   Systolic 104 120  Diastolic 64 67 96  Pulse 90 85      Physical Exam Cardiovascular:     Rate and Rhythm: Normal rate and regular rhythm.     Pulses: Intact distal pulses.     Heart sounds: Normal heart sounds. No murmur heard.    No gallop.     Comments: No leg edema, no JVD. Pulmonary:     Effort: Pulmonary effort is normal.     Breath sounds: Normal breath sounds.  Abdominal:     General: Bowel sounds are normal.     Palpations: Abdomen is soft.    Laboratory examination:  External labs:  Cholesterol, total 140.000 m 09/15/2021 HDL 47.000 mg 09/15/2021 LDL 67.000 mg 09/15/2021 Triglycerides 184.000 m 09/15/2021  A1C 6.200 % of 09/15/2021  Hemoglobin 11.700 g/d 11/26/2019  Creatinine, Serum 1.340 mg/ 11/09/2021 Potassium 4.400 mm 11/09/2021  Cholesterol, total 162.000 m 10/28/2020 HDL 38.000 mg 10/28/2020 LDL 85.000 mg 10/28/2020 Triglycerides 327.000 m 10/28/2020  A1C 6.800 % of 10/28/2020  Medications and allergies  No Known Allergies    Current Outpatient Medications:    nitroGLYCERIN (NITROSTAT) 0.4 MG SL tablet, PLACE 1 TABLET (0.4 MG TOTAL) UNDER THE TONGUE EVERY 5 (FIVE) MINUTES AS NEEDED FOR CHEST PAIN., Disp: 25 tablet, Rfl: 3   ASPIRIN LOW DOSE 81 MG chewable tablet, Chew and swallow 1 tablet by mouth daily., Disp: 90 tablet, Rfl: 3   cyclobenzaprine (FLEXERIL) 5 MG tablet, Take 5 mg by mouth at bedtime., Disp: , Rfl:    empagliflozin (JARDIANCE)  25 MG TABS tablet, Take 1 tablet (25 mg total) by mouth daily before breakfast., Disp: 90 tablet, Rfl: 3   ezetimibe (ZETIA) 10 MG tablet, Take 1 tablet by mouth daily at bedtime for cholesterol., Disp: 90 tablet, Rfl: 0   icosapent Ethyl (VASCEPA) 1 g capsule, Take 1 g by mouth 2 (two) times daily., Disp: , Rfl:    isosorbide mononitrate (IMDUR) 30 MG 24 hr tablet, Take 1 tablet by mouth daily., Disp: 90 tablet, Rfl: 3   ketoconazole (NIZORAL) 2 % shampoo, SMARTSIG:5 Milliliter(s) Topical 2-3 Times Weekly, Disp: , Rfl:    metFORMIN (GLUCOPHAGE) 1000 MG tablet, Take 1 tablet (1,000 mg total) by mouth 2 (two) times daily with a meal., Disp: , Rfl:    metoprolol succinate (TOPROL-XL) 50 MG 24 hr tablet, Take 1 tablet (50 mg total) by mouth daily. Take with or immediately following a meal., Disp: 90 tablet, Rfl: 3   olmesartan (BENICAR) 20 MG tablet, Take 1 tablet by mouth daily in the evening., Disp: 90 tablet, Rfl: 3   omeprazole (PRILOSEC) 40 MG capsule, Take 40 mg by mouth daily. , Disp: , Rfl:    prednisoLONE acetate (PRED FORTE) 1 % ophthalmic suspension, 1 drop See admin instructions. Left ear daily, Disp: , Rfl:    rosuvastatin (CRESTOR) 10 MG tablet, Take 1 tablet by mouth daily., Disp: 90 tablet, Rfl: 3   Radiology:   No results found.  Cardiac Studies:    Echocardiogram 2011:  - Left ventricle: The cavity size was normal. Wall thickness was     normal. Systolic function was normal. The estimated ejection     fraction was in the range of 60% to 65%. Wall motion was normal;     there were no regional wall motion abnormalities. Left ventricular     diastolic function parameters were normal.   - Right ventricle: The cavity size was normal. Systolic function was  normal.   - Pulmonary arteries: No TR doppler jet so unable to estimate PA systolic pressure.    Left heart catheterization 10/18/2019: LV: Normal LV systolic function, normal LVEDP. Left main: Large vessel.  No significant  disease. LAD: Very large vessel.  Proximal LAD has tandem 50 and a 40% stenosis followed by mild ectasia.  There is dye hang-up noted in the proximal segment.  Minimal disease in the mid to distal LAD.  Diagonal 2 is small to medium sized vessel with a proximal 50% to 60% stenosis. Circumflex: Very large vessel with giving origin to small OM1 and continues as a large OM 2.  Between OM1 and OM 2 there  is a 30% luminal irregularity. Right coronary artery: Large vessel, dominant vessel.  No significant disease.   IVUS LAD: There is moderate amount of soft plaque and very minimal calcification noted in the proximal LAD.  The vessel size was 4.7 x 5.0 mm and lumen area was 2.67 x 2.99 giving 6.28 millimeter2 lumen area.   Recommendation: Patient does not have any ulcerations or unstable plaque, significant amount of soft plaque is evident that could potentially rupture, however the intima appears to be very intact.  Aggressive risk factor modification is indicated.  We will discontinue simvastatin and switch him to Crestor, 20 mg daily along with Zetia 10 mg daily, increase metoprolol from 25 mg daily to 50 mg of succinate daily, I will also start him on Effient 10 mg daily (Insurance preferred) for at least 2 to 3 months along with aspirin.  EKG  EKG 11/20/2021: Normal sinus rhythm at rate of 83 bpm.  Normal EKG.   Assessment     ICD-10-CM   1. Coronary artery disease of native artery of native heart with stable angina pectoris (HCC)  I25.118 EKG 12-Lead    2. Primary hypertension  I10     3. Mixed hyperlipidemia  E78.2      Medications Discontinued During This Encounter  Medication Reason   buPROPion (WELLBUTRIN XL) 150 MG 24 hr tablet    Coenzyme Q10 (CO Q 10) 100 MG CAPS    pioglitazone (ACTOS) 30 MG tablet    zolpidem (AMBIEN CR) 6.25 MG CR tablet     Recommendations:   Marcelo Layken Doenges  is a 46 y.o. [place] American male with hypertension, diabetes melitus, strong family history  of early CAD with father having had MI l in his 68s, with chest pain. seen by [place] on 10/16/2019 for symptoms suggestive of unstable angina pectoris, cardiac catheterization on 10/18/2019 revealed moderate disease in the proximal LAD and recommended medical therapy.  Symptoms of angina has resolved on medical therapy.  He has resumed all his activities without limitations.  He is still extremely anxious and would like me to order some imaging, advised him that we will perform a routine treadmill exercise stress test to reassure him.  I suspect his plaque in his coronaries is now probably 10 to 20% or less.  He has developed severe myalgias, he did not tolerate any of the statins including Lipitor, Crestor and Zocor, we will reduce the dose of the rosuvastatin to 5 mg daily, will continue with Zetia.  He is obtaining lipids in 2 to 3 months from his PCP.  Blood pressure is well controlled, he has developed rectal dysfunction since being on antihypertensive medications, he is also diabetic.  Diabetes is improved since being on Jardiance.  I prescribed him sildenafil, he was previously placed on isosorbide mononitrate for angina and as he has been on aggressive medical therapy, will discontinue this.  Metoprolol succinate could also be contributing to his erectile dysfunction, will change it to propranolol XL 60 mg daily.  This should also help him with his anxiety.  Could consider psychotherapy and referral to psychologist for management of generalized anxiety disorder.  Otherwise stable from cardiac standpoint I will see him back in a year.     Yates Decamp, MD, [facility] 11/20/2021, 11:20 AM Office: [phone]

## 2022-01-01 ENCOUNTER — Ambulatory Visit: Payer: No Typology Code available for payment source

## 2022-01-01 DIAGNOSIS — I25118 Atherosclerotic heart disease of native coronary artery with other forms of angina pectoris: Secondary | ICD-10-CM

## 2022-01-03 ENCOUNTER — Encounter: Payer: Self-pay | Admitting: Cardiology

## 2022-01-04 NOTE — Telephone Encounter (Signed)
From patient.

## 2022-01-05 NOTE — Telephone Encounter (Signed)
From pt

## 2022-01-11 ENCOUNTER — Ambulatory Visit: Payer: No Typology Code available for payment source | Admitting: Cardiology

## 2022-01-11 ENCOUNTER — Encounter: Payer: Self-pay | Admitting: Cardiology

## 2022-01-11 VITALS — BP 143/83 | HR 85 | Temp 98.0°F | Resp 16 | Ht 72.0 in | Wt 187.4 lb

## 2022-01-11 DIAGNOSIS — E119 Type 2 diabetes mellitus without complications: Secondary | ICD-10-CM

## 2022-01-11 DIAGNOSIS — F410 Panic disorder [episodic paroxysmal anxiety] without agoraphobia: Secondary | ICD-10-CM

## 2022-01-11 DIAGNOSIS — R002 Palpitations: Secondary | ICD-10-CM

## 2022-01-11 MED ORDER — ATENOLOL 25 MG PO TABS: 25.0000 mg | ORAL_TABLET | ORAL | 1 refills | Status: DC

## 2022-01-11 NOTE — Progress Notes (Signed)
Primary Physician/Referring:  Iona Beard, MD  Patient ID: Philip [name], male    DOB: 1975/11/18, 46 y.o.   MRN: [id number]  Chief Complaint  Patient presents with   Coronary Artery Disease   Follow-up   HPI:    Philip [name]  is a 46 y.o. [name] male with hypertension, diabetes melitus, strong family history of early CAD with father having had MI l in his 56s, with chest pain. seen by [place] on 10/16/2019 for symptoms suggestive of unstable angina pectoris, cardiac catheterization on 10/18/2019 revealed moderate disease in the proximal LAD and recommended medical therapy.  Symptoms of angina has resolved on medical therapy.  He made an appointment to see me on an urgent basis as he had an episode of rapid heartbeat that occurred 2 days ago.  He is presently feeling well, he has held taking olmesartan as his blood pressure was low over the weekend after the episode on Friday.  Denies chest pain, shortness of breath, syncope.  Presently feels well and asymptomatic.  Past Medical History:  Diagnosis Date   Chest pain 2011   Diabetes mellitus    Hyperlipidemia    Hypertension    Reflux    Past Surgical History:  Procedure Laterality Date   INTRAVASCULAR ULTRASOUND/IVUS N/A 10/18/2019   Procedure: Intravascular Ultrasound/IVUS;  Surgeon: Adrian Prows, MD;  Location: [facility];  Service: Cardiovascular;  Laterality: N/A;   LEFT HEART CATH AND CORONARY ANGIOGRAPHY N/A 10/18/2019   Procedure: LEFT HEART CATH AND CORONARY ANGIOGRAPHY;  Surgeon: Adrian Prows, MD;  Location: [facility];  Service: Cardiovascular;  Laterality: N/A;   Family History  Problem Relation Age of Onset   Hyperlipidemia Mother    CAD Father        MI in 62s   Hypertension Father    Hypertension Sister    Hypertension Brother     Social History   Tobacco Use   Smoking status: Former    Packs/day: 0.50    Years: 15.00    Total pack years: 7.50    Types: Cigarettes    Quit date:  2017    Years since quitting: 6.7   Smokeless tobacco: Never  Substance Use Topics   Alcohol use: Yes    Comment: occasionally    Marital Status: Married  ROS  Review of Systems  Cardiovascular:  Negative for chest pain, dyspnea on exertion and leg swelling.  Gastrointestinal:  Negative for melena.   Objective  Blood pressure (!) 143/83, pulse 85, temperature 98 F (36.7 C), temperature source Temporal, resp. rate 16, height 6' (1.829 m), weight 187 lb 6.4 oz (85 kg).     01/11/2022    2:38 PM 11/20/2021   11:09 AM 10/31/2020   10:24 AM  Vitals with BMI  Height 6\' 0"  6\' 0"  6\' 0"   Weight 187 lbs 6 oz 194 lbs 220 lbs 3 oz  BMI 25.41 53.61 44.31  Systolic 540 086 761  Diastolic 83 64 67  Pulse 85 90 85     Physical Exam Cardiovascular:     Rate and Rhythm: Normal rate and regular rhythm.     Pulses: Intact distal pulses.     Heart sounds: Normal heart sounds. No murmur heard.    No gallop.     Comments: No leg edema, no JVD. Pulmonary:     Effort: Pulmonary effort is normal.     Breath sounds: Normal breath sounds.  Abdominal:  General: Bowel sounds are normal.     Palpations: Abdomen is soft.    Laboratory examination:  External labs:  Cholesterol, total 125.000 m 01/04/2022 HDL 45.000 mg 01/04/2022 LDL 58.000 mg 01/04/2022 Triglycerides 140.000 m 01/04/2022  A1C 5.500 % of 01/04/2022  Hemoglobin 11.700 g/d 11/26/2019 Creatinine, Serum 1.220 mg/ 01/04/2022 Potassium 5.100 mm 01/04/2022  A1C 6.800 % of 10/28/2020  Medications and allergies   Allergies  Allergen Reactions   Propranolol Other (See Comments)    Extreme anxiety      Current Outpatient Medications:    ASPIRIN LOW DOSE 81 MG chewable tablet, Chew and swallow 1 tablet by mouth daily., Disp: 90 tablet, Rfl: 3   atenolol (TENORMIN) 25 MG tablet, Take 1 tablet (25 mg total) by mouth as directed. 1-2 tablets for anxiety and palpitations, Disp: 60 tablet, Rfl: 1   cyclobenzaprine (FLEXERIL) 5 MG tablet,  Take 5 mg by mouth at bedtime., Disp: , Rfl:    ezetimibe (ZETIA) 10 MG tablet, Take 1 tablet by mouth daily at bedtime for cholesterol., Disp: 90 tablet, Rfl: 0   icosapent Ethyl (VASCEPA) 1 g capsule, Take 1 g by mouth 2 (two) times daily., Disp: , Rfl:    ketoconazole (NIZORAL) 2 % shampoo, SMARTSIG:5 Milliliter(s) Topical 2-3 Times Weekly, Disp: , Rfl:    metFORMIN (GLUCOPHAGE) 1000 MG tablet, Take 1,000 mg by mouth daily with breakfast., Disp: , Rfl:    nitroGLYCERIN (NITROSTAT) 0.4 MG SL tablet, PLACE 1 TABLET (0.4 MG TOTAL) UNDER THE TONGUE EVERY 5 (FIVE) MINUTES AS NEEDED FOR CHEST PAIN., Disp: 25 tablet, Rfl: 3   omeprazole (PRILOSEC) 10 MG capsule, Take 10 mg by mouth daily., Disp: , Rfl:    prednisoLONE acetate (PRED FORTE) 1 % ophthalmic suspension, 1 drop See admin instructions. Left ear daily, Disp: , Rfl:    rosuvastatin (CRESTOR) 10 MG tablet, Take 0.5 tablets (5 mg total) by mouth daily., Disp: 90 tablet, Rfl: 3   sildenafil (VIAGRA) 100 MG tablet, Take 1 tablet (100 mg total) by mouth daily as needed for erectile dysfunction., Disp: 30 tablet, Rfl: 3   empagliflozin (JARDIANCE) 10 MG TABS tablet, Take 1 tablet (10 mg total) by mouth daily before breakfast., Disp: , Rfl:    olmesartan (BENICAR) 20 MG tablet, Take 1 tablet by mouth daily in the evening. (Patient not taking: Reported on 01/11/2022), Disp: 90 tablet, Rfl: 3   Radiology:   No results found.  Cardiac Studies:    Echocardiogram 2011:  - Left ventricle: The cavity size was normal. Wall thickness was     normal. Systolic function was normal. The estimated ejection     fraction was in the range of 60% to 65%. Wall motion was normal;     there were no regional wall motion abnormalities. Left ventricular     diastolic function parameters were normal.   - Right ventricle: The cavity size was normal. Systolic function was  normal.   - Pulmonary arteries: No TR doppler jet so unable to estimate PA systolic pressure.     Left heart catheterization 10/18/2019: LV: Normal LV systolic function, normal LVEDP. Left main: Large vessel.  No significant disease. LAD: Very large vessel.  Proximal LAD has tandem 50 and a 40% stenosis followed by mild ectasia.  There is dye hang-up noted in the proximal segment.  Minimal disease in the mid to distal LAD.  Diagonal 2 is small to medium sized vessel with a proximal 50% to 60% stenosis. Circumflex: Very large vessel  with giving origin to small OM1 and continues as a large OM 2.  Between OM1 and OM 2 there is a 30% luminal irregularity. Right coronary artery: Large vessel, dominant vessel.  No significant disease.   IVUS LAD: There is moderate amount of soft plaque and very minimal calcification noted in the proximal LAD.  The vessel size was 4.7 x 5.0 mm and lumen area was 2.67 x 2.99 giving 6.28 millimeter2 lumen area.   Recommendation: Patient does not have any ulcerations or unstable plaque, significant amount of soft plaque is evident that could potentially rupture, however the intima appears to be very intact.  Aggressive risk factor modification is indicated.  We will discontinue simvastatin and switch him to Crestor, 20 mg daily along with Zetia 10 mg daily, increase metoprolol from 25 mg daily to 50 mg of succinate daily, I will also start him on Effient 10 mg daily (Insurance preferred) for at least 2 to 3 months along with aspirin.  PCV CARDIAC STRESS TEST 01/01/2022  Narrative Exercise treadmill stress test 01/01/2022: Exercise treadmill stress test performed using Bruce protocol.  Patient reached 6.5 METS, and 86% of age predicted maximum heart rate.  Exercise capacity was low.  No chest pain reported.  Normal heart rate and hemodynamic response. Stress EKG revealed no ischemic changes. Low risk study. Recommend clinical correlation due to low exercise capacity.    EKG  EKG 11/20/2021: Normal sinus rhythm at rate of 83 bpm.  Normal EKG.   Assessment      ICD-10-CM   1. Palpitations  R00.2 atenolol (TENORMIN) 25 MG tablet    2. Panic attack  F41.0 atenolol (TENORMIN) 25 MG tablet    3. Controlled type 2 diabetes mellitus without complication, without long-term current use of insulin (HCC)  E11.9 empagliflozin (JARDIANCE) 10 MG TABS tablet      Medications Discontinued During This Encounter  Medication Reason   propranolol ER (INDERAL LA) 60 MG 24 hr capsule    omeprazole (PRILOSEC) 40 MG capsule    empagliflozin (JARDIANCE) 25 MG TABS tablet     Recommendations:   Philip [name]  is a 46 y.o. [place] American male with hypertension, diabetes melitus, strong family history of early CAD with father having had MI l in his 79s, with chest pain. seen by [place] on 10/16/2019 for symptoms suggestive of unstable angina pectoris, cardiac catheterization on 10/18/2019 revealed moderate disease in the proximal LAD and recommended medical therapy.  Symptoms of angina has resolved on medical therapy.  He has resumed all his activities without limitations.  I seen him about 3 to 4 weeks ago, due to extreme anxiety, I had ordered a treadmill stress test, which is completely normal except reduced physical activity and exercise tolerance.  He made an appointment urgently to see me as 3 days ago he had an episode of rapid palpitations and he also felt his blood pressure was low.  EMS was activated, EKG essentially revealing normal sinus rhythm at rate of 90 bpm, without evidence of ischemia.  Palpitation episode lasted for about 2 to 3 hours, heart rate was normal.  >110 bpm.  So do not suspect arrhythmias or SVT or atrial fibrillation.  Patient has extreme anxiety.  I have advised that he should seek help and probably consider psychotherapy.  I also prescribed him atenolol to be taken on a as needed basis for palpitations.  His blood pressure was also low but today it is again creeping back up, he will go back  on olmesartan which she has been presently holding.   Some of his symptoms may be related to low blood pressure as well, I have advised him to reduce the dose of Jardiance to 10 mg daily as his diabetes is now well controlled.  Otherwise stable from cardiac standpoint, he has an appointment to see me in a year, he will keep that appointment. All his risk factors are very well controlled including control of diabetes mellitus, hypertension and also hyperlipidemia.  He has also lost weight and is maintaining weight loss.   Yates Decamp, MD, [facility] 01/11/2022, 3:19 PM Office: [phone]

## 2022-01-14 ENCOUNTER — Other Ambulatory Visit: Payer: Self-pay | Admitting: Cardiology

## 2022-01-15 NOTE — Telephone Encounter (Signed)
Refill request

## 2022-01-26 ENCOUNTER — Other Ambulatory Visit: Payer: Self-pay | Admitting: Cardiology

## 2022-01-26 DIAGNOSIS — E119 Type 2 diabetes mellitus without complications: Secondary | ICD-10-CM

## 2022-02-01 ENCOUNTER — Other Ambulatory Visit: Payer: Self-pay | Admitting: Cardiology

## 2022-02-01 DIAGNOSIS — I10 Essential (primary) hypertension: Secondary | ICD-10-CM

## 2022-02-15 ENCOUNTER — Encounter: Payer: Self-pay | Admitting: Cardiology

## 2022-02-17 NOTE — Telephone Encounter (Signed)
From patient.

## 2022-02-22 NOTE — Telephone Encounter (Signed)
From patient.

## 2022-02-23 NOTE — Telephone Encounter (Signed)
From pt

## 2022-03-13 ENCOUNTER — Other Ambulatory Visit: Payer: Self-pay | Admitting: Cardiology

## 2022-03-13 DIAGNOSIS — F410 Panic disorder [episodic paroxysmal anxiety] without agoraphobia: Secondary | ICD-10-CM

## 2022-03-13 DIAGNOSIS — R002 Palpitations: Secondary | ICD-10-CM

## 2022-03-26 ENCOUNTER — Other Ambulatory Visit: Payer: Self-pay | Admitting: Cardiology

## 2022-03-26 DIAGNOSIS — E119 Type 2 diabetes mellitus without complications: Secondary | ICD-10-CM

## 2022-04-15 ENCOUNTER — Other Ambulatory Visit: Payer: Self-pay | Admitting: Cardiology

## 2022-04-15 DIAGNOSIS — F410 Panic disorder [episodic paroxysmal anxiety] without agoraphobia: Secondary | ICD-10-CM

## 2022-04-15 DIAGNOSIS — R002 Palpitations: Secondary | ICD-10-CM

## 2022-06-02 ENCOUNTER — Other Ambulatory Visit: Payer: Self-pay

## 2022-06-02 DIAGNOSIS — F410 Panic disorder [episodic paroxysmal anxiety] without agoraphobia: Secondary | ICD-10-CM

## 2022-06-02 DIAGNOSIS — R002 Palpitations: Secondary | ICD-10-CM

## 2022-06-03 ENCOUNTER — Encounter: Payer: Self-pay | Admitting: Cardiology

## 2022-06-03 ENCOUNTER — Telehealth: Payer: Self-pay

## 2022-06-03 ENCOUNTER — Other Ambulatory Visit: Payer: Self-pay | Admitting: Cardiology

## 2022-06-03 DIAGNOSIS — F410 Panic disorder [episodic paroxysmal anxiety] without agoraphobia: Secondary | ICD-10-CM

## 2022-06-03 DIAGNOSIS — R002 Palpitations: Secondary | ICD-10-CM

## 2022-06-03 DIAGNOSIS — I25118 Atherosclerotic heart disease of native coronary artery with other forms of angina pectoris: Secondary | ICD-10-CM

## 2022-06-03 DIAGNOSIS — E119 Type 2 diabetes mellitus without complications: Secondary | ICD-10-CM

## 2022-06-03 MED ORDER — ATENOLOL 25 MG PO TABS: 25.0000 mg | ORAL_TABLET | ORAL | 0 refills | Status: DC

## 2022-06-03 MED ORDER — DAPAGLIFLOZIN PROPANEDIOL 10 MG PO TABS: 10.0000 mg | ORAL_TABLET | Freq: Every day | ORAL | 3 refills | Status: DC

## 2022-06-03 NOTE — Telephone Encounter (Signed)
From patient

## 2022-06-03 NOTE — Progress Notes (Signed)
ICD-10-CM   1. Controlled type 2 diabetes mellitus without complication, without long-term current use of insulin (HCC)  E11.9 dapagliflozin propanediol (FARXIGA) 10 MG TABS tablet    2. Palpitations  R00.2 atenolol (TENORMIN) 25 MG tablet    3. Panic attack  F41.0 atenolol (TENORMIN) 25 MG tablet    4. Coronary artery disease of native artery of native heart with stable angina pectoris (HCC)  I25.118 atenolol (TENORMIN) 25 MG tablet    dapagliflozin propanediol (FARXIGA) 10 MG TABS tablet     Meds ordered this encounter  Medications   atenolol (TENORMIN) 25 MG tablet    Sig: Take 1 tablet (25 mg total) by mouth as directed. Take 1-2 tablets daily for anxiety    Dispense:  180 tablet    Refill:  0    ZERO refills remain on this prescription. Your patient is requesting advance approval of refills for this medication to PREVENT ANY MISSED DOSES   dapagliflozin propanediol (FARXIGA) 10 MG TABS tablet    Sig: Take 1 tablet (10 mg total) by mouth daily before breakfast.    Dispense:  90 tablet    Refill:  3    Discontinue Jardiance    Medications Discontinued During This Encounter  Medication Reason   JARDIANCE 25 MG TABS tablet Patient Preference   empagliflozin (JARDIANCE) 10 MG TABS tablet Patient Preference   atenolol (TENORMIN) 25 MG tablet Reorder

## 2022-06-03 NOTE — Telephone Encounter (Signed)
Pharmacy is asking for clarification on Atenolol directions. How many tablets is patient allowed to take daily.

## 2022-06-08 MED ORDER — ATENOLOL 25 MG PO TABS: 25.0000 mg | ORAL_TABLET | ORAL | 0 refills | Status: DC

## 2022-06-08 NOTE — Telephone Encounter (Signed)
ICD-10-CM   1. Palpitations  R00.2 atenolol (TENORMIN) 25 MG tablet    2. Panic attack  F41.0 atenolol (TENORMIN) 25 MG tablet    3. Coronary artery disease of native artery of native heart with stable angina pectoris (HCC)  I25.118 atenolol (TENORMIN) 25 MG tablet     Meds ordered this encounter  Medications   atenolol (TENORMIN) 25 MG tablet    Sig: Take 1 tablet (25 mg total) by mouth as directed. Take 1-2 tablets once daily for anxiety as needed    Dispense:  180 tablet    Refill:  0    ZERO refills remain on this prescription. Your patient is requesting advance approval of refills for this medication to [facility]

## 2022-06-08 NOTE — Addendum Note (Signed)
Addended by: Kela Millin on: 06/08/2022 02:25 PM   Modules accepted: Orders

## 2022-06-11 DIAGNOSIS — M5412 Radiculopathy, cervical region: Secondary | ICD-10-CM | POA: Insufficient documentation

## 2022-07-24 ENCOUNTER — Other Ambulatory Visit: Payer: Self-pay | Admitting: Cardiology

## 2022-07-24 DIAGNOSIS — I25118 Atherosclerotic heart disease of native coronary artery with other forms of angina pectoris: Secondary | ICD-10-CM

## 2022-07-24 DIAGNOSIS — E782 Mixed hyperlipidemia: Secondary | ICD-10-CM

## 2022-09-07 ENCOUNTER — Other Ambulatory Visit: Payer: Self-pay | Admitting: Cardiology

## 2022-09-07 DIAGNOSIS — I25118 Atherosclerotic heart disease of native coronary artery with other forms of angina pectoris: Secondary | ICD-10-CM

## 2022-09-07 DIAGNOSIS — F410 Panic disorder [episodic paroxysmal anxiety] without agoraphobia: Secondary | ICD-10-CM

## 2022-09-07 DIAGNOSIS — R002 Palpitations: Secondary | ICD-10-CM

## 2022-09-14 ENCOUNTER — Other Ambulatory Visit: Payer: Self-pay | Admitting: Cardiology

## 2022-09-14 DIAGNOSIS — I10 Essential (primary) hypertension: Secondary | ICD-10-CM

## 2022-09-15 ENCOUNTER — Other Ambulatory Visit: Payer: Self-pay | Admitting: Cardiology

## 2022-09-15 DIAGNOSIS — I10 Essential (primary) hypertension: Secondary | ICD-10-CM

## 2022-11-11 ENCOUNTER — Other Ambulatory Visit: Payer: Self-pay | Admitting: Cardiology

## 2022-11-11 DIAGNOSIS — F410 Panic disorder [episodic paroxysmal anxiety] without agoraphobia: Secondary | ICD-10-CM

## 2022-11-11 DIAGNOSIS — I25118 Atherosclerotic heart disease of native coronary artery with other forms of angina pectoris: Secondary | ICD-10-CM

## 2022-11-11 DIAGNOSIS — R002 Palpitations: Secondary | ICD-10-CM

## 2022-11-24 ENCOUNTER — Ambulatory Visit: Payer: No Typology Code available for payment source | Admitting: Cardiology

## 2022-12-01 ENCOUNTER — Ambulatory Visit: Payer: No Typology Code available for payment source | Admitting: Cardiology

## 2022-12-14 ENCOUNTER — Ambulatory Visit: Payer: No Typology Code available for payment source | Admitting: Cardiology

## 2023-01-10 ENCOUNTER — Other Ambulatory Visit: Payer: Self-pay | Admitting: Cardiology

## 2023-01-10 DIAGNOSIS — I25118 Atherosclerotic heart disease of native coronary artery with other forms of angina pectoris: Secondary | ICD-10-CM

## 2023-01-20 DIAGNOSIS — M2010 Hallux valgus (acquired), unspecified foot: Secondary | ICD-10-CM | POA: Insufficient documentation

## 2023-01-24 ENCOUNTER — Ambulatory Visit: Payer: No Typology Code available for payment source | Admitting: Cardiology

## 2023-02-03 ENCOUNTER — Other Ambulatory Visit: Payer: Self-pay | Admitting: Cardiology

## 2023-02-03 DIAGNOSIS — R002 Palpitations: Secondary | ICD-10-CM

## 2023-02-03 DIAGNOSIS — F410 Panic disorder [episodic paroxysmal anxiety] without agoraphobia: Secondary | ICD-10-CM

## 2023-02-03 DIAGNOSIS — I25118 Atherosclerotic heart disease of native coronary artery with other forms of angina pectoris: Secondary | ICD-10-CM

## 2023-02-03 DIAGNOSIS — M2022 Hallux rigidus, left foot: Secondary | ICD-10-CM | POA: Insufficient documentation

## 2023-03-03 ENCOUNTER — Other Ambulatory Visit: Payer: Self-pay | Admitting: Cardiology

## 2023-03-03 DIAGNOSIS — E119 Type 2 diabetes mellitus without complications: Secondary | ICD-10-CM

## 2023-03-03 DIAGNOSIS — I25118 Atherosclerotic heart disease of native coronary artery with other forms of angina pectoris: Secondary | ICD-10-CM

## 2023-03-05 ENCOUNTER — Other Ambulatory Visit: Payer: Self-pay | Admitting: Cardiology

## 2023-03-05 DIAGNOSIS — I25118 Atherosclerotic heart disease of native coronary artery with other forms of angina pectoris: Secondary | ICD-10-CM

## 2023-03-06 ENCOUNTER — Other Ambulatory Visit: Payer: Self-pay | Admitting: Cardiology

## 2023-03-06 DIAGNOSIS — E119 Type 2 diabetes mellitus without complications: Secondary | ICD-10-CM

## 2023-03-07 NOTE — Progress Notes (Unsigned)
Cardiology Office Note:  .   Date:  03/08/2023  ID:  Philip [name], DOB February 03, 1976, MRN [id number] PCP: Mirna Mires, MD  [facility] Providers Cardiologist:  Yates Decamp, MD {  History of Present Illness: .   Philip [name] is a 47 y.o. male with a past medical history of hypertension, diabetes mellitus, strong family history of early CAD with father having an MI in his 66s, with chest pain originally seen June 2021 for symptoms suggestive of unstable angina, cardiac catheterization 10/18/2019 revealing moderate disease in the proximal LAD and recommended medical therapy.  Luckily, symptoms of angina resolved with medical therapy.  Made an appointment to see Dr. Jacinto Halim on urgent basis as he an episode of rapid heartbeat that occurred 2 days prior.  (Seen September 2023).  Presently was feeling well and has been taking his olmesartan for blood pressure which was low over the weekend after the episode on Friday.  Denies chest pain, shortness of breath, syncope.  Was feeling well at present and asymptomatic.  Today, he presents with a known history of coronary artery disease,  with concerns about his heart health. He reports experiencing body pains, which he believes are a side effect of his current statin medication, Crestor. The patient expresses anxiety about the state of his coronary arteries and requests further testing. He reports no shortness of breath or other cardiac symptoms and is able to perform daily activities, including yard work, without issue. The patient is also actively managing his diabetes with regular visits to his primary care provider and use of an A1c tracker.  Reports no shortness of breath nor dyspnea on exertion. Reports no chest pain, pressure, or tightness. No edema, orthopnea, PND. Reports no palpitations.    Discussed the use of AI scribe software for clinical note transcription with the patient, who gave verbal consent to proceed.   ROS: Pertinent  ROS in HPI  Studies Reviewed: Marland Kitchen   EKG Interpretation Date/Time:  Tuesday March 08 2023 10:57:59 EST Ventricular Rate:  77 PR Interval:  158 QRS Duration:  82 QT Interval:  376 QTC Calculation: 425 R Axis:   56  Text Interpretation: Sinus rhythm with occasional Premature ventricular complexes When compared with ECG of 18-Oct-2019 13:06, Premature ventricular complexes are now Present Nonspecific T wave abnormality no longer evident in Lateral leads Confirmed by Jari Favre [phone]) on 03/08/2023 11:18:16 AM    Echocardiogram 2011:  - Left ventricle: The cavity size was normal. Wall thickness was     normal. Systolic function was normal. The estimated ejection     fraction was in the range of 60% to 65%. Wall motion was normal;     there were no regional wall motion abnormalities. Left ventricular     diastolic function parameters were normal.   - Right ventricle: The cavity size was normal. Systolic function was  normal.   - Pulmonary arteries: No TR doppler jet so unable to estimate PA systolic pressure.     Left heart catheterization 10/18/2019: LV: Normal LV systolic function, normal LVEDP. Left main: Large vessel.  No significant disease. LAD: Very large vessel.  Proximal LAD has tandem 50 and a 40% stenosis followed by mild ectasia.  There is dye hang-up noted in the proximal segment.  Minimal disease in the mid to distal LAD.  Diagonal 2 is small to medium sized vessel with a proximal 50% to 60% stenosis. Circumflex: Very large vessel with giving origin to small OM1 and continues as a  large OM 2.  Between OM1 and OM 2 there is a 30% luminal irregularity. Right coronary artery: Large vessel, dominant vessel.  No significant disease.   IVUS LAD: There is moderate amount of soft plaque and very minimal calcification noted in the proximal LAD.  The vessel size was 4.7 x 5.0 mm and lumen area was 2.67 x 2.99 giving 6.28 millimeter2 lumen area.   Recommendation: Patient does not have  any ulcerations or unstable plaque, significant amount of soft plaque is evident that could potentially rupture, however the intima appears to be very intact.  Aggressive risk factor modification is indicated.  We will discontinue simvastatin and switch him to Crestor, 20 mg daily along with Zetia 10 mg daily, increase metoprolol from 25 mg daily to 50 mg of succinate daily, I will also start him on Effient 10 mg daily (Insurance preferred) for at least 2 to 3 months along with aspirin.   PCV CARDIAC STRESS TEST 01/01/2022   Narrative Exercise treadmill stress test 01/01/2022: Exercise treadmill stress test performed using Bruce protocol.  Patient reached 6.5 METS, and 86% of age predicted maximum heart rate.  Exercise capacity was low.  No chest pain reported.  Normal heart rate and hemodynamic response. Stress EKG revealed no ischemic changes. Low risk study. Recommend clinical correlation due to low exercise capacity.       Physical Exam:   VS:  BP 132/82 (BP Location: Left Arm, Patient Position: Sitting, Cuff Size: Normal)   Pulse 96   Resp 16   Ht 6' (1.829 m)   Wt 189 lb 9.6 oz (86 kg)   SpO2 97%   BMI 25.71 kg/m    Wt Readings from Last 3 Encounters:  03/08/23 189 lb 9.6 oz (86 kg)  01/11/22 187 lb 6.4 oz (85 kg)  11/20/21 194 lb (88 kg)    GEN: Well nourished, well developed in no acute distress NECK: No JVD; No carotid bruits CARDIAC: RRR with occasional PVC, no murmurs, rubs, gallops RESPIRATORY:  Clear to auscultation without rales, wheezing or rhonchi  ABDOMEN: Soft, non-tender, non-distended EXTREMITIES:  No edema; No deformity   ASSESSMENT AND PLAN: .   Panic attack -now in therapy and doing better  Controlled type 2 diabetes mellitus -continue monitoring per PCP  Coronary Artery Disease Patient reports occasional chest pain and anxiety related to the status of his coronary arteries. Last catheterization was in 2021, with moderate disease noted in the LAD. No  current symptoms of heart failure. -Order coronary CTA to assess current status of coronary arteries. -Continue current therapy for anxiety as it seems to be helping.  Hyperlipidemia LDL of 96, which is above the current target of less than 55. Patient reports significant muscle pain with statin therapy, which has been trialed with multiple agents. -Start Repatha (evolocumab), an injectable PCSK9 inhibitor, to lower LDL. -Continue Crestor (rosuvastatin) for 4 weeks while Repatha takes effect, then discontinue. -Repeat lipid panel and liver function tests in 8 weeks to assess response to therapy.  Follow-up -Schedule follow-up appointment with Dr. Jacinto Halim in February.      Signed, Sharlene Dory, PA-C

## 2023-03-08 ENCOUNTER — Encounter: Payer: Self-pay | Admitting: Physician Assistant

## 2023-03-08 ENCOUNTER — Ambulatory Visit: Payer: No Typology Code available for payment source | Attending: Cardiology | Admitting: Physician Assistant

## 2023-03-08 ENCOUNTER — Other Ambulatory Visit: Payer: Self-pay | Admitting: Physician Assistant

## 2023-03-08 VITALS — BP 132/82 | HR 96 | Resp 16 | Ht 72.0 in | Wt 189.6 lb

## 2023-03-08 DIAGNOSIS — F410 Panic disorder [episodic paroxysmal anxiety] without agoraphobia: Secondary | ICD-10-CM

## 2023-03-08 DIAGNOSIS — E119 Type 2 diabetes mellitus without complications: Secondary | ICD-10-CM | POA: Diagnosis not present

## 2023-03-08 DIAGNOSIS — R002 Palpitations: Secondary | ICD-10-CM

## 2023-03-08 DIAGNOSIS — I25119 Atherosclerotic heart disease of native coronary artery with unspecified angina pectoris: Secondary | ICD-10-CM

## 2023-03-08 MED ORDER — REPATHA SURECLICK 140 MG/ML ~~LOC~~ SOAJ
140.0000 mg | SUBCUTANEOUS | 6 refills | Status: DC
Start: 2023-03-08 — End: 2023-03-08

## 2023-03-08 MED ORDER — METOPROLOL TARTRATE 100 MG PO TABS: ORAL_TABLET | ORAL | 0 refills | Status: DC

## 2023-03-08 NOTE — Patient Instructions (Signed)
Medication Instructions:  Start Repatha 140 mg/ml once a week Stop Crestor 4 weeks after starting Repatha Take Metoprolol Tartrate 100 mg 2 hour prior to test *If you need a refill on your cardiac medications before your next appointment, please call your pharmacy*   Lab Work: Today we will draw a BMP In 8 weeks we will draw LFTs and Lipid panel If you have labs (blood work) drawn today and your tests are completely normal, you will receive your results only by: MyChart Message (if you have MyChart) OR A paper copy in the mail If you have any lab test that is abnormal or we need to change your treatment, we will call you to review the results.   Testing/Procedures:   Your cardiac CT will be scheduled at one of the below locations:     If scheduled at [facility], please arrive at the [facility] and Children's Entrance (Entrance C2) of [facility] 30 minutes prior to test start time. You can use the FREE valet parking offered at entrance C (encouraged to control the heart rate for the test)  Proceed to the [facility] Radiology Department (first floor) to check-in and test prep.  All radiology patients and guests should use entrance C2 at [facility], accessed from [facility], even though the hospital's physical address listed is [address].      There is spacious parking and easy access to the radiology department from the [facility] entrance. Please enter here and check-in with the desk attendant.   Please follow these instructions carefully (unless otherwise directed):  An IV will be required for this test and Nitroglycerin will be given.  Hold all erectile dysfunction medications at least 3 days (72 hrs) prior to test. (Ie viagra, cialis, sildenafil, tadalafil, etc)   On the Night Before the Test: Be sure to Drink plenty of water. Do not consume any caffeinated/decaffeinated beverages or chocolate 12 hours prior to  your test. Do not take any antihistamines 12 hours prior to your test.  On the Day of the Test: Drink plenty of water until 1 hour prior to the test. Do not eat any food 1 hour prior to test. You may take your regular medications prior to the test.  Take metoprolol (Lopressor) two hours prior to test. If you take Furosemide/Hydrochlorothiazide/Spironolactone, please HOLD on the morning of the test.  After the Test: Drink plenty of water. After receiving IV contrast, you may experience a mild flushed feeling. This is normal. On occasion, you may experience a mild rash up to 24 hours after the test. This is not dangerous. If this occurs, you can take Benadryl 25 mg and increase your fluid intake. If you experience trouble breathing, this can be serious. If it is severe call 911 IMMEDIATELY. If it is mild, please call our office. If you take any of these medications: Metformin please do not take 48 hours after completing test unless otherwise instructed.  We will call to schedule your test 2-4 weeks out understanding that some insurance companies will need an authorization prior to the service being performed.   For more information and frequently asked questions, please visit our website : [email or web address]  For non-scheduling related questions, please contact the cardiac imaging nurse navigator should you have any questions/concerns: Cardiac Imaging Nurse Navigators Direct Office Dial: [phone]   For scheduling needs, including cancellations and rescheduling, please call [place], [phone].    Follow-Up: At [facility], you  and your health needs are our priority.  As part of our continuing mission to provide you with exceptional heart care, we have created designated Provider Care Teams.  These Care Teams include your primary Cardiologist (physician) and Advanced Practice Providers (APPs -  Physician Assistants and Nurse Practitioners) who all work  together to provide you with the care you need, when you need it.  We recommend signing up for the patient portal called "MyChart".  Sign up information is provided on this After Visit Summary.  MyChart is used to connect with patients for Virtual Visits (Telemedicine).  Patients are able to view lab/test results, encounter notes, upcoming appointments, etc.  Non-urgent messages can be sent to your provider as well.   To learn more about what you can do with MyChart, go to [email or web address].    Your next appointment:   3-4 month(s)  Provider:   Yates Decamp, MD

## 2023-03-09 LAB — BASIC METABOLIC PANEL
BUN/Creatinine Ratio: 11 (ref 9–20)
BUN: 14 mg/dL (ref 6–24)
CO2: 23 mmol/L (ref 20–29)
Calcium: 9.4 mg/dL (ref 8.7–10.2)
Chloride: 103 mmol/L (ref 96–106)
Creatinine, Ser: 1.28 mg/dL — ABNORMAL HIGH (ref 0.76–1.27)
Glucose: 74 mg/dL (ref 70–99)
Potassium: 4.6 mmol/L (ref 3.5–5.2)
Sodium: 140 mmol/L (ref 134–144)
eGFR: 70 mL/min/{1.73_m2} (ref >59)

## 2023-03-09 LAB — LIPID PANEL
Chol/HDL Ratio: 3.2 ratio (ref 0.0–5.0)
Cholesterol, Total: 147 mg/dL (ref 100–199)
HDL: 46 mg/dL (ref >39)
LDL Chol Calc (NIH): 75 mg/dL (ref 0–99)
Triglycerides: 153 mg/dL — ABNORMAL HIGH (ref 0–149)
VLDL Cholesterol Cal: 26 mg/dL (ref 5–40)

## 2023-03-09 LAB — HEPATIC FUNCTION PANEL
ALT: 13 [IU]/L (ref 0–44)
AST: 20 [IU]/L (ref 0–40)
Albumin: 4.9 g/dL (ref 4.1–5.1)
Alkaline Phosphatase: 62 [IU]/L (ref 44–121)
Bilirubin Total: 0.2 mg/dL (ref 0.0–1.2)
Bilirubin, Direct: 0.12 mg/dL (ref 0.00–0.40)
Total Protein: 8 g/dL (ref 6.0–8.5)

## 2023-03-09 MED ORDER — REPATHA SURECLICK 140 MG/ML ~~LOC~~ SOAJ
140.0000 mg | SUBCUTANEOUS | 6 refills | Status: DC
Start: 2023-03-09 — End: 2023-06-22

## 2023-03-09 NOTE — Addendum Note (Signed)
Addended by: Clotilde Dieter on: 03/09/2023 09:43 AM   Modules accepted: Orders

## 2023-03-10 ENCOUNTER — Encounter: Payer: Self-pay | Admitting: Physician Assistant

## 2023-03-14 MED ORDER — EMPAGLIFLOZIN 25 MG PO TABS
25.0000 mg | ORAL_TABLET | Freq: Every day | ORAL | 3 refills | Status: AC
Start: 2023-03-14 — End: ?

## 2023-03-16 ENCOUNTER — Telehealth: Payer: Self-pay | Admitting: Pharmacy Technician

## 2023-03-16 NOTE — Telephone Encounter (Signed)
Pharmacy Patient Advocate Encounter   Received notification from Patient Advice Request messages that prior authorization for repatha is required/requested.   Insurance verification completed.   The patient is insured through  [employer]  .   Per test claim: PA required; PA submitted to above mentioned insurance via CoverMyMeds Key/confirmation #/EOC [facility] Status is pending

## 2023-03-20 ENCOUNTER — Ambulatory Visit
Admission: EM | Admit: 2023-03-20 | Discharge: 2023-03-20 | Disposition: A | Discharge status: Home / Self Care | Payer: No Typology Code available for payment source | Attending: Internal Medicine | Admitting: Internal Medicine

## 2023-03-20 DIAGNOSIS — S61213A Laceration without foreign body of left middle finger without damage to nail, initial encounter: Secondary | ICD-10-CM

## 2023-03-20 DIAGNOSIS — Z23 Encounter for immunization: Secondary | ICD-10-CM | POA: Diagnosis not present

## 2023-03-20 DIAGNOSIS — S61211A Laceration without foreign body of left index finger without damage to nail, initial encounter: Secondary | ICD-10-CM

## 2023-03-20 MED ORDER — CEPHALEXIN 500 MG PO CAPS: 500.0000 mg | ORAL_CAPSULE | Freq: Three times a day (TID) | ORAL | 0 refills | Status: AC

## 2023-03-20 MED ORDER — TETANUS-DIPHTH-ACELL PERTUSSIS 5-2.5-18.5 LF-MCG/0.5 IM SUSY
0.5000 mL | PREFILLED_SYRINGE | Freq: Once | INTRAMUSCULAR | Status: AC
  Administered 2023-03-20: 0.5 mL via INTRAMUSCULAR

## 2023-03-20 NOTE — Discharge Instructions (Signed)
Keep the wounds clean and dry for the next 24 hours.  After this time you may gently cleanse it but do not soak or saturate the wounds.  The surgical tape used to closure wounds will fall off on its own over the next 4 to 5 days.  Start Keflex 3 times a day for 4 days to help prevent infection.  Your tetanus is also been updated today in clinic and is good for 10 years.  Please follow-up with your PCP if you have any signs of infection which include but are not limited to redness, drainage, warmth, fevers, swelling of the wounds.  Please go to the ER if you develop any worsening symptoms.  I hope you feel better soon!

## 2023-03-20 NOTE — ED Triage Notes (Signed)
Pt presents to UC w/ c/o 2nd and 3rd left hand finger lacerations. This occurred yesterday afternoon when he was sliding a carving knife into its sleeve, but it cut through to his fingers. Not currently bleeding. He is unsure of when his last tetanus vaccine was.

## 2023-03-20 NOTE — ED Provider Notes (Signed)
[facility]    CSN: [id number] Arrival date & time: 03/20/23  0841      History   Chief Complaint Chief Complaint  Patient presents with   Extremity Laceration    HPI Philip [name] is a 47 y.o. male presents for laceration.  Patient reports yesterday while placing the [place] carving knife back in the case he accidentally cut his left second and third finger.  States he cleaned it with soap and water.  He is not on any blood thinning medications.  He does not know his tetanus status.  No numbness or tingling of the affected fingers.  No other concerns at this time.  HPI  Past Medical History:  Diagnosis Date   Chest pain 2011   Diabetes mellitus    Hyperlipidemia    Hypertension    Reflux     Patient Active Problem List   Diagnosis Date Noted   Coronary artery disease involving native coronary artery of native heart with unstable angina pectoris (HCC)    LAD stenosis    Unstable angina (HCC) 10/16/2019   Mixed hyperlipidemia 10/16/2019   Controlled type 2 diabetes mellitus without complication, without long-term current use of insulin (HCC) 10/16/2019   Essential hypertension 10/16/2019    Past Surgical History:  Procedure Laterality Date   CORONARY ULTRASOUND/IVUS N/A 10/18/2019   Procedure: Intravascular Ultrasound/IVUS;  Surgeon: Yates Decamp, MD;  Location: [facility];  Service: Cardiovascular;  Laterality: N/A;   LEFT HEART CATH AND CORONARY ANGIOGRAPHY N/A 10/18/2019   Procedure: LEFT HEART CATH AND CORONARY ANGIOGRAPHY;  Surgeon: Yates Decamp, MD;  Location: [facility] CV [facility];  Service: Cardiovascular;  Laterality: N/A;       Home Medications    Prior to Admission medications   Medication Sig Start Date End Date Taking? Authorizing Provider  cephALEXin (KEFLEX) 500 MG capsule Take 1 capsule (500 mg total) by mouth 3 (three) times daily for 4 days. 03/20/23 03/24/23 Yes Radford Pax, NP  aspirin (ASPIRIN LOW DOSE) 81 MG chewable tablet Chew  1 tablet (81 mg total) by mouth daily. 03/08/23   Yates Decamp, MD  atenolol (TENORMIN) 25 MG tablet TAKE 1 TABLET(25 MG TOTAL) BY MOUTH AS DIRECTED. TAKE 1 TO 2 TABLETS DAILY FOR ANXIETY 02/03/23   Yates Decamp, MD  buPROPion (WELLBUTRIN XL) 150 MG 24 hr tablet Take 1 tablet by mouth every morning. 01/15/22   Yates Decamp, MD  cyclobenzaprine (FLEXERIL) 5 MG tablet Take 5 mg by mouth at bedtime.    [provider]  empagliflozin (JARDIANCE) 25 MG TABS tablet Take 1 tablet (25 mg total) by mouth daily before breakfast. 03/14/23   Yates Decamp, MD  Evolocumab (REPATHA SURECLICK) 140 MG/ML SOAJ Inject 140 mg into the skin every 14 (fourteen) days. 03/09/23   Sharlene Dory, PA-C  ezetimibe (ZETIA) 10 MG tablet Take 1 tablet by mouth daily at bedtime for cholesterol. 10/05/21   Yates Decamp, MD  icosapent Ethyl (VASCEPA) 1 g capsule Take 1 g by mouth 2 (two) times daily. 06/08/21   [provider]  ketoconazole (NIZORAL) 2 % shampoo SMARTSIG:5 Milliliter(s) Topical 2-3 Times Weekly 10/12/20   [provider]  metFORMIN (GLUCOPHAGE) 1000 MG tablet Take 1,000 mg by mouth daily with breakfast. 10/20/19   Yates Decamp, MD  metoprolol tartrate (LOPRESSOR) 100 MG tablet Take 2 hours prior to Cardiac CT 03/08/23   Sharlene Dory, PA-C  nitroGLYCERIN (NITROSTAT) 0.4 MG SL tablet PLACE 1 TABLET (0.4 MG  TOTAL) UNDER THE TONGUE EVERY 5 (FIVE) MINUTES AS NEEDED FOR CHEST PAIN. 11/19/19 01/11/22  Yates Decamp, MD  olmesartan (BENICAR) 20 MG tablet Take 1 tablet by mouth daily in the evening. 09/16/22   Yates Decamp, MD  omeprazole (PRILOSEC) 10 MG capsule Take 10 mg by mouth daily.    [provider]  prednisoLONE acetate (PRED FORTE) 1 % ophthalmic suspension 1 drop See admin instructions. Left ear daily 07/14/19   [provider]  REPATHA SURECLICK 140 MG/ML SOAJ Inject 140 mg under the skin every 14 days. 03/08/23   Sharlene Dory, PA-C  rosuvastatin (CRESTOR) 10 MG tablet Take 1 tablet by mouth  daily. 07/26/22   Yates Decamp, MD  sildenafil (VIAGRA) 100 MG tablet Take 1 tablet (100 mg total) by mouth daily as needed for erectile dysfunction. 11/20/21   Yates Decamp, MD    Family History Family History  Problem Relation Age of Onset   Hyperlipidemia Mother    CAD Father        MI in 48s   Hypertension Father    Hypertension Sister    Hypertension Brother     Social History Social History   Tobacco Use   Smoking status: Former    Current packs/day: 0.00    Average packs/day: 0.5 packs/day for 15.0 years (7.5 ttl pk-yrs)    Types: Cigarettes    Start date: 2002    Quit date: 2017    Years since quitting: 7.9   Smokeless tobacco: Never  Vaping Use   Vaping status: Never Used  Substance Use Topics   Alcohol use: Yes    Comment: occasionally    Drug use: Never     Allergies   Propranolol   Review of Systems Review of Systems  Skin:        Finger laceration     Physical Exam Triage Vital Signs ED Triage Vitals  Encounter Vitals Group     BP 03/20/23 0908 (!) 152/89     Systolic BP Percentile --      Diastolic BP Percentile --      Pulse Rate 03/20/23 0908 91     Resp 03/20/23 0908 18     Temp 03/20/23 0908 98.3 F (36.8 C)     Temp Source 03/20/23 0908 Oral     SpO2 03/20/23 0908 99 %     Weight --      Height --      Head Circumference --      Peak Flow --      Pain Score 03/20/23 0906 1     Pain Loc --      Pain Education --      Exclude from Growth Chart --    No data found.  Updated Vital Signs BP (!) 152/89 (BP Location: Right Arm)   Pulse 91   Temp 98.3 F (36.8 C) (Oral)   Resp 18   SpO2 99%   Visual Acuity Right Eye Distance:   Left Eye Distance:   Bilateral Distance:    Right Eye Near:   Left Eye Near:    Bilateral Near:     Physical Exam Vitals and nursing note reviewed.  Constitutional:      General: He is not in acute distress.    Appearance: Normal appearance. He is not ill-appearing.  HENT:     Head: Normocephalic  and atraumatic.  Eyes:     Pupils: Pupils are equal, round, and reactive to light.  Cardiovascular:  Rate and Rhythm: Normal rate.  Pulmonary:     Effort: Pulmonary effort is normal.  Musculoskeletal:       Hands:  Skin:    General: Skin is warm and dry.  Neurological:     General: No focal deficit present.     Mental Status: He is alert and oriented to person, place, and time.  Psychiatric:        Mood and Affect: Mood normal.        Behavior: Behavior normal.      UC Treatments / Results  Labs (all labs ordered are listed, but only abnormal results are displayed) Labs Reviewed - No data to display  EKG   Radiology No results found.  Procedures Laceration Repair  Date/Time: 03/20/2023 9:47 AM  Performed by: Radford Pax, NP Authorized by: Radford Pax, NP   Consent:    Consent obtained:  Verbal   Consent given by:  Patient   Risks, benefits, and alternatives were discussed: yes     Risks discussed:  Infection, nerve damage, poor wound healing, poor cosmetic result, tendon damage, vascular damage, retained foreign body, pain and need for additional repair   Alternatives discussed:  Observation, referral and no treatment Universal protocol:    Patient identity confirmed:  Verbally with patient Anesthesia:    Anesthesia method:  None Laceration details:    Location:  Finger   Finger location:  L index finger (Left index and left ring finger) Pre-procedure details:    Preparation:  Patient was prepped and draped in usual sterile fashion Exploration:    Wound exploration: wound explored through full range of motion     Contaminated: no   Treatment:    Area cleansed with:  Chlorhexidine   Amount of cleaning:  Standard   Debridement:  None   Undermining:  None Skin repair:    Repair method:  Steri-Strips   Number of Steri-Strips:  3 (1 Steri-Strip to the left third finger laceration and 2 to the left second finger) Approximation:    Approximation:   Close Repair type:    Repair type:  Simple Post-procedure details:    Dressing:  Non-adherent dressing   Procedure completion:  Tolerated well, no immediate complications  (including critical care time)  Medications Ordered in UC Medications  Tdap (BOOSTRIX) injection 0.5 mL (has no administration in time range)    Initial Impression / Assessment and Plan / UC Course  I have reviewed the triage vital signs and the nursing notes.  Pertinent labs & imaging results that were available during my care of the patient were reviewed by me and considered in my medical decision making (see chart for details).   Updated patient tetanus in clinic.  Wound care/signs and symptoms of infection reviewed and patient verbalized understanding.  Start Keflex to prevent infection.  PCP follow-up for any signs of infection.  ER precautions reviewed. Final Clinical Impressions(s) / UC Diagnoses   Final diagnoses:  Laceration of left index finger without foreign body without damage to nail, initial encounter  Laceration of left middle finger without foreign body without damage to nail, initial encounter     Discharge Instructions      Keep the wounds clean and dry for the next 24 hours.  After this time you may gently cleanse it but do not soak or saturate the wounds.  The surgical tape used to closure wounds will fall off on its own over the next 4 to 5 days.  Start Keflex  3 times a day for 4 days to help prevent infection.  Your tetanus is also been updated today in clinic and is good for 10 years.  Please follow-up with your PCP if you have any signs of infection which include but are not limited to redness, drainage, warmth, fevers, swelling of the wounds.  Please go to the ER if you develop any worsening symptoms.  I hope you feel better soon!     ED Prescriptions     Medication Sig Dispense Auth. Provider   cephALEXin (KEFLEX) 500 MG capsule Take 1 capsule (500 mg total) by mouth 3 (three) times  daily for 4 days. 12 capsule Radford Pax, NP      PDMP not reviewed this encounter.   Radford Pax, NP 03/20/23 [phone]

## 2023-03-21 ENCOUNTER — Other Ambulatory Visit (HOSPITAL_COMMUNITY): Payer: Self-pay

## 2023-03-24 NOTE — Telephone Encounter (Signed)
Pharmacy Patient Advocate Encounter  Received notification from  [employer]  that Prior Authorization for repatha has been APPROVED from 03/22/23 to 03/21/24   PA #/Case ID/Reference #: [id number]

## 2023-03-27 ENCOUNTER — Ambulatory Visit: Payer: No Typology Code available for payment source

## 2023-04-28 ENCOUNTER — Other Ambulatory Visit: Payer: Self-pay

## 2023-04-28 DIAGNOSIS — R072 Precordial pain: Secondary | ICD-10-CM

## 2023-05-10 ENCOUNTER — Telehealth: Payer: Self-pay

## 2023-05-10 ENCOUNTER — Encounter (HOSPITAL_COMMUNITY): Payer: Self-pay

## 2023-05-10 DIAGNOSIS — R002 Palpitations: Secondary | ICD-10-CM

## 2023-05-10 NOTE — Telephone Encounter (Signed)
Called patient they were schedule for Cardiac CTA on for Thursday ordered Bmet and gave patient instructions on the CTA

## 2023-05-11 LAB — BASIC METABOLIC PANEL
BUN/Creatinine Ratio: 12 (ref 9–20)
BUN: 16 mg/dL (ref 6–24)
CO2: 24 mmol/L (ref 20–29)
Calcium: 10 mg/dL (ref 8.7–10.2)
Chloride: 102 mmol/L (ref 96–106)
Creatinine, Ser: 1.3 mg/dL — ABNORMAL HIGH (ref 0.76–1.27)
Glucose: 74 mg/dL (ref 70–99)
Potassium: 4.7 mmol/L (ref 3.5–5.2)
Sodium: 142 mmol/L (ref 134–144)
eGFR: 69 mL/min/{1.73_m2} (ref >59)

## 2023-05-12 ENCOUNTER — Ambulatory Visit (HOSPITAL_COMMUNITY)
Admission: RE | Admit: 2023-05-12 | Discharge: 2023-05-12 | Disposition: A | Discharge status: Home / Self Care | Payer: No Typology Code available for payment source | Source: Ambulatory Visit | Attending: Physician Assistant | Admitting: Physician Assistant

## 2023-05-12 DIAGNOSIS — R072 Precordial pain: Secondary | ICD-10-CM | POA: Insufficient documentation

## 2023-05-12 MED ORDER — NITROGLYCERIN 0.4 MG SL SUBL
SUBLINGUAL_TABLET | SUBLINGUAL | Status: AC
Start: 2023-05-12 — End: ?
  Filled 2023-05-12: qty 2

## 2023-05-12 MED ORDER — METOPROLOL TARTRATE 5 MG/5ML IV SOLN
5.0000 mg | Freq: Once | INTRAVENOUS | Status: AC
  Administered 2023-05-12: 5 mg via INTRAVENOUS

## 2023-05-12 MED ORDER — IOHEXOL 350 MG/ML SOLN
95.0000 mL | Freq: Once | INTRAVENOUS | Status: AC | PRN
  Administered 2023-05-12: 95 mL via INTRAVENOUS

## 2023-05-12 MED ORDER — NITROGLYCERIN 0.4 MG SL SUBL
0.8000 mg | SUBLINGUAL_TABLET | Freq: Once | SUBLINGUAL | Status: AC
  Administered 2023-05-12: 0.8 mg via SUBLINGUAL

## 2023-05-12 MED ORDER — METOPROLOL TARTRATE 5 MG/5ML IV SOLN
INTRAVENOUS | Status: AC
  Filled 2023-05-12: qty 10

## 2023-05-12 MED ORDER — METOPROLOL TARTRATE 5 MG/5ML IV SOLN
5.0000 mg | Freq: Once | INTRAVENOUS | Status: AC
Start: 2023-05-12 — End: 2023-05-12
  Administered 2023-05-12: 5 mg via INTRAVENOUS

## 2023-05-18 ENCOUNTER — Encounter: Payer: Self-pay | Admitting: Physician Assistant

## 2023-06-02 ENCOUNTER — Ambulatory Visit: Payer: BC Managed Care – PPO | Admitting: Cardiology

## 2023-06-07 LAB — LIPID PANEL
Chol/HDL Ratio: 3.8 {ratio} (ref 0.0–5.0)
Cholesterol, Total: 163 mg/dL (ref 100–199)
HDL: 43 mg/dL (ref >39)
LDL Chol Calc (NIH): 82 mg/dL (ref 0–99)
Triglycerides: 230 mg/dL — ABNORMAL HIGH (ref 0–149)
VLDL Cholesterol Cal: 38 mg/dL (ref 5–40)

## 2023-06-07 LAB — HEPATIC FUNCTION PANEL
ALT: 13 [IU]/L (ref 0–44)
AST: 19 [IU]/L (ref 0–40)
Albumin: 4.7 g/dL (ref 4.1–5.1)
Alkaline Phosphatase: 70 [IU]/L (ref 44–121)
Bilirubin Total: 0.3 mg/dL (ref 0.0–1.2)
Bilirubin, Direct: 0.09 mg/dL (ref 0.00–0.40)
Total Protein: 7.8 g/dL (ref 6.0–8.5)

## 2023-06-09 ENCOUNTER — Ambulatory Visit: Payer: No Typology Code available for payment source | Admitting: Cardiology

## 2023-06-10 ENCOUNTER — Encounter: Payer: Self-pay | Admitting: Physician Assistant

## 2023-06-21 NOTE — Progress Notes (Unsigned)
 Cardiology Office Note:  .   Date:  06/22/2023  ID:  Leonia Reeves, DOB 1976/01/02, MRN [id number] PCP: Mirna Mires, MD  [facility] Providers Cardiologist:  Yates Decamp, MD {  History of Present Illness: .   Valdemar Shimshon Narula is a 48 y.o. male with a past medical history of hypertension, diabetes mellitus, strong family history of early CAD with father having an MI in his 62s, with chest pain originally seen June 2021 for symptoms suggestive of unstable angina, cardiac catheterization 10/18/2019 revealing moderate disease in the proximal LAD and recommended medical therapy.  Luckily, symptoms of angina resolved with medical therapy.  Made an appointment to see Dr. Jacinto Halim on urgent basis as he an episode of rapid heartbeat that occurred 2 days prior.  (Seen September 2023).  Presently was feeling well and has been taking his olmesartan for blood pressure which was low over the weekend after the episode on Friday.  Denies chest pain, shortness of breath, syncope.  Was feeling well at present and asymptomatic.  He was seen by me 02/2023, he presents with a known history of coronary artery disease,  with concerns about his heart health. He reports experiencing body pains, which he believes are a side effect of his current statin medication, Crestor. The patient expresses anxiety about the state of his coronary arteries and requests further testing. He reports no shortness of breath or other cardiac symptoms and is able to perform daily activities, including yard work, without issue. The patient is also actively managing his diabetes with regular visits to his primary care provider and use of an A1c tracker.  Reports no shortness of breath nor dyspnea on exertion. Reports no chest pain, pressure, or tightness. No edema, orthopnea, PND. Reports no palpitations.   Discussed the use of AI scribe software for clinical note transcription with the patient, who gave verbal consent to  proceed.  Today, he presents with a history of coronary artery disease and diabetes,  with concerns about his recent CT scan results and high triglyceride levels. He reports no symptoms of chest pain or shortness of breath. He has been adhering to a diet of lean meats, vegetables, and whole grains, and reports taking his medications as prescribed, including Repatha for cholesterol, Vascepa for triglycerides, and metformin and Jardiance for diabetes. The patient also mentions some anxiety and stress related to family issues.  Reports no shortness of breath nor dyspnea on exertion. Reports no chest pain, pressure, or tightness. No edema, orthopnea, PND. Reports no palpitations.   Discussed the use of AI scribe software for clinical note transcription with the patient, who gave verbal consent to proceed.   ROS: Pertinent ROS in HPI  Studies Reviewed: .        Echocardiogram 2011:  - Left ventricle: The cavity size was normal. Wall thickness was     normal. Systolic function was normal. The estimated ejection     fraction was in the range of 60% to 65%. Wall motion was normal;     there were no regional wall motion abnormalities. Left ventricular     diastolic function parameters were normal.   - Right ventricle: The cavity size was normal. Systolic function was  normal.   - Pulmonary arteries: No TR doppler jet so unable to estimate PA systolic pressure.     Left heart catheterization 10/18/2019: LV: Normal LV systolic function, normal LVEDP. Left main: Large vessel.  No significant disease. LAD: Very large vessel.  Proximal LAD has  tandem 50 and a 40% stenosis followed by mild ectasia.  There is dye hang-up noted in the proximal segment.  Minimal disease in the mid to distal LAD.  Diagonal 2 is small to medium sized vessel with a proximal 50% to 60% stenosis. Circumflex: Very large vessel with giving origin to small OM1 and continues as a large OM 2.  Between OM1 and OM 2 there is a 30%  luminal irregularity. Right coronary artery: Large vessel, dominant vessel.  No significant disease.   IVUS LAD: There is moderate amount of soft plaque and very minimal calcification noted in the proximal LAD.  The vessel size was 4.7 x 5.0 mm and lumen area was 2.67 x 2.99 giving 6.28 millimeter2 lumen area.   Recommendation: Patient does not have any ulcerations or unstable plaque, significant amount of soft plaque is evident that could potentially rupture, however the intima appears to be very intact.  Aggressive risk factor modification is indicated.  We will discontinue simvastatin and switch him to Crestor, 20 mg daily along with Zetia 10 mg daily, increase metoprolol from 25 mg daily to 50 mg of succinate daily, I will also start him on Effient 10 mg daily (Insurance preferred) for at least 2 to 3 months along with aspirin.   [facility] CARDIAC STRESS TEST 01/01/2022   Narrative Exercise treadmill stress test 01/01/2022: Exercise treadmill stress test performed using Bruce protocol.  Patient reached 6.5 METS, and 86% of age predicted maximum heart rate.  Exercise capacity was low.  No chest pain reported.  Normal heart rate and hemodynamic response. Stress EKG revealed no ischemic changes. Low risk study. Recommend clinical correlation due to low exercise capacity.       Physical Exam:   VS:  BP 96/68   Pulse 80   Ht 6' (1.829 m)   Wt 185 lb 9.6 oz (84.2 kg)   SpO2 99%   BMI 25.17 kg/m    Wt Readings from Last 3 Encounters:  06/22/23 185 lb 9.6 oz (84.2 kg)  03/08/23 189 lb 9.6 oz (86 kg)  01/11/22 187 lb 6.4 oz (85 kg)    GEN: Well nourished, well developed in no acute distress NECK: No JVD; No carotid bruits CARDIAC: RRR with occasional PVC, no murmurs, rubs, gallops RESPIRATORY:  Clear to auscultation without rales, wheezing or rhonchi  ABDOMEN: Soft, non-tender, non-distended EXTREMITIES:  No edema; No deformity   ASSESSMENT AND PLAN: .    Coronary Artery  Disease Progression noted on recent CT scan with new minimal disease in the right coronary artery. No symptoms suggestive of significant stenosis. Patient has a history of mild blockages in the LAD and circumflex. -Continue current management. -Referral to lipid clinic for further evaluation and management of hyperlipidemia.  Hyperlipidemia LDL slightly above goal, triglycerides elevated despite adherence to a healthy diet and current medication regimen. Patient is on Repatha and metformin. -Resume Vascepa (generic), 2g twice daily with food. -Check lipid panel in a few months after consistent use of Vascepa.  Type 2 Diabetes Mellitus Well-controlled with Jardiance and metformin, recent A1C was 5.5. -Continue current management.  Anxiety/panic attacks Patient has been seeing a therapist and reports improvement. -Continue current management.  Follow-up in a few months to re-assess.  Signed, Sharlene Dory, PA-C

## 2023-06-22 ENCOUNTER — Encounter: Payer: Self-pay | Admitting: Physician Assistant

## 2023-06-22 ENCOUNTER — Ambulatory Visit: Payer: No Typology Code available for payment source | Attending: Physician Assistant | Admitting: Physician Assistant

## 2023-06-22 VITALS — BP 96/68 | HR 80 | Ht 72.0 in | Wt 185.6 lb

## 2023-06-22 DIAGNOSIS — R072 Precordial pain: Secondary | ICD-10-CM

## 2023-06-22 DIAGNOSIS — E782 Mixed hyperlipidemia: Secondary | ICD-10-CM

## 2023-06-22 DIAGNOSIS — F410 Panic disorder [episodic paroxysmal anxiety] without agoraphobia: Secondary | ICD-10-CM | POA: Diagnosis not present

## 2023-06-22 DIAGNOSIS — I25118 Atherosclerotic heart disease of native coronary artery with other forms of angina pectoris: Secondary | ICD-10-CM | POA: Diagnosis not present

## 2023-06-22 DIAGNOSIS — E119 Type 2 diabetes mellitus without complications: Secondary | ICD-10-CM | POA: Diagnosis not present

## 2023-06-22 MED ORDER — ICOSAPENT ETHYL 1 G PO CAPS: 2.0000 g | ORAL_CAPSULE | Freq: Two times a day (BID) | ORAL | 3 refills | Status: AC

## 2023-06-22 NOTE — Patient Instructions (Signed)
 Medication Instructions:   Your physician recommends that you continue on your current medications as directed. Please refer to the Current Medication list given to you today.   *If you need a refill on your cardiac medications before your next appointment, please call your pharmacy*   Lab Work:   PLEASE GO DOWN STAIRS  [facility]  FIRST FLOOR  [address] ( GET OFF ELEVATORS MAKE A LEFT AND ANOTHER LEFT LAB ON RIGHT DOWN HALLWAY :  RETURN IN 8 WEEKS  LFT AND LIPID    If you have labs (blood work) drawn today and your tests are completely normal, you will receive your results only by: MyChart Message (if you have MyChart) OR A paper copy in the mail If you have any lab test that is abnormal or we need to change your treatment, we will call you to review the results.   Testing/Procedures:  NONE ORDERED  TODAY      Follow-Up: At [facility], you and your health needs are our priority.  As part of our continuing mission to provide you with exceptional heart care, we have created designated Provider Care Teams.  These Care Teams include your primary Cardiologist (physician) and Advanced Practice Providers (APPs -  Physician Assistants and Nurse Practitioners) who all work together to provide you with the care you need, when you need it.  We recommend signing up for the patient portal called "MyChart".  Sign up information is provided on this After Visit Summary.  MyChart is used to connect with patients for Virtual Visits (Telemedicine).  Patients are able to view lab/test results, encounter notes, upcoming appointments, etc.  Non-urgent messages can be sent to your provider as well.   To learn more about what you can do with MyChart, go to [email or web address].    Your next appointment:  WITH PHARM -D LIPID CLINIC  (MED MANAGEMENT)  3 month(s)   Provider:    Yates Decamp, MD  or Jari Favre, PA-C        Other Instructions   1st Floor: - Lobby - Registration  - Pharmacy   - Lab - Cafe  2nd Floor: - PV Lab - Diagnostic Testing (echo, CT, nuclear med)  3rd Floor: - Vacant  4th Floor: - TCTS (cardiothoracic surgery) - AFib Clinic - Structural Heart Clinic - Vascular Surgery  - Vascular Ultrasound  5th Floor: - HeartCare Cardiology (general and EP) - Clinical Pharmacy for coumadin, hypertension, lipid, weight-loss medications, and med management appointments    Valet parking services will be available as well.

## 2023-07-05 NOTE — Telephone Encounter (Signed)
 Please review results from PCP as requested

## 2023-08-06 ENCOUNTER — Other Ambulatory Visit: Payer: Self-pay

## 2023-08-06 ENCOUNTER — Ambulatory Visit
Admission: RE | Admit: 2023-08-06 | Discharge: 2023-08-06 | Disposition: A | Discharge status: Home / Self Care | Source: Ambulatory Visit | Attending: Physician Assistant

## 2023-08-06 VITALS — BP 125/78 | HR 99 | Temp 97.7°F | Resp 17 | Ht 72.0 in | Wt 175.0 lb

## 2023-08-06 DIAGNOSIS — R197 Diarrhea, unspecified: Secondary | ICD-10-CM | POA: Diagnosis not present

## 2023-08-06 NOTE — ED Notes (Signed)
 Patient provided materials to collect stool sample. Given instructions to return stool sample here.  Pt's identification sticker put on sample cup. Pt verbalized understanding of instructions.

## 2023-08-06 NOTE — ED Provider Notes (Signed)
 [facility]    CSN: [id number] Arrival date & time: 08/06/23  1108      History   Chief Complaint Chief Complaint  Patient presents with   Diarrhea    Just came from [place] non stop diarrhea since return - Entered by patient    HPI Philip [name] is a 48 y.o. male.   HPI  He reports eating street food on last day of trip to [place] during flights home he vomited but was overall okay on plane Now he has nonstop diarrhea since 08/04/23  He reports nausea and vomiting have resolved  He denies abdominal pain, limited PO intake, fever, chills, blood stools He reports current stools are watery in appearance   He has tried Pepto for the past few days but this has not provided much relief     Past Medical History:  Diagnosis Date   Chest pain 2011   Diabetes mellitus    Hyperlipidemia    Hypertension    Reflux     Patient Active Problem List   Diagnosis Date Noted   Coronary artery disease involving native coronary artery of native heart with unstable angina pectoris (HCC)    LAD stenosis    Unstable angina (HCC) 10/16/2019   Mixed hyperlipidemia 10/16/2019   Controlled type 2 diabetes mellitus without complication, without long-term current use of insulin (HCC) 10/16/2019   Essential hypertension 10/16/2019    Past Surgical History:  Procedure Laterality Date   CORONARY ULTRASOUND/IVUS N/A 10/18/2019   Procedure: Intravascular Ultrasound/IVUS;  Surgeon: Knox Perl, MD;  Location: [facility];  Service: Cardiovascular;  Laterality: N/A;   LEFT HEART CATH AND CORONARY ANGIOGRAPHY N/A 10/18/2019   Procedure: LEFT HEART CATH AND CORONARY ANGIOGRAPHY;  Surgeon: Knox Perl, MD;  Location: [facility];  Service: Cardiovascular;  Laterality: N/A;       Home Medications    Prior to Admission medications   Medication Sig Start Date End Date Taking? Authorizing Provider  aspirin  (ASPIRIN  LOW DOSE) 81 MG chewable tablet Chew 1  tablet (81 mg total) by mouth daily. 03/08/23   Knox Perl, MD  atenolol  (TENORMIN ) 25 MG tablet TAKE 1 TABLET(25 MG TOTAL) BY MOUTH AS DIRECTED. TAKE 1 TO 2 TABLETS DAILY FOR ANXIETY 02/03/23   Knox Perl, MD  buPROPion  (WELLBUTRIN  XL) 150 MG 24 hr tablet Take 1 tablet by mouth every morning. 01/15/22   Knox Perl, MD  Continuous Glucose Sensor (DEXCOM G7 SENSOR) MISC as directed. 02/21/23   [provider]  cyclobenzaprine (FLEXERIL) 5 MG tablet Take 5 mg by mouth at bedtime.    [provider]  empagliflozin  (JARDIANCE ) 25 MG TABS tablet Take 1 tablet (25 mg total) by mouth daily before breakfast. 03/14/23   Knox Perl, MD  ezetimibe  (ZETIA ) 10 MG tablet Take 1 tablet by mouth daily at bedtime for cholesterol. 10/05/21   Knox Perl, MD  icosapent  Ethyl (VASCEPA ) 1 g capsule Take 2 capsules (2 g total) by mouth 2 (two) times daily. 06/22/23   Von Grumbling, PA-C  ketoconazole (NIZORAL) 2 % shampoo SMARTSIG:5 Milliliter(s) Topical 2-3 Times Weekly 10/12/20   [provider]  metFORMIN (GLUCOPHAGE) 1000 MG tablet Take 1,000 mg by mouth daily with breakfast. 10/20/19   Knox Perl, MD  Multiple Vitamin (MULTI VITAMIN) TABS Take 1 tablet by mouth daily at 6 (six) AM.    [provider]  nitroGLYCERIN  (NITROSTAT ) 0.4 MG SL tablet PLACE 1 TABLET (0.4 MG TOTAL) UNDER  THE TONGUE EVERY 5 (FIVE) MINUTES AS NEEDED FOR CHEST PAIN. 11/19/19 06/22/23  Knox Perl, MD  ofloxacin (FLOXIN) 0.3 % OTIC solution Place 5 drops into both ears daily.    [provider]  olmesartan  (BENICAR ) 20 MG tablet Take 1 tablet by mouth daily in the evening. 09/16/22   Knox Perl, MD  pantoprazole (PROTONIX) 40 MG tablet Take 40 mg by mouth daily.    [provider]  PARoxetine (PAXIL-CR) 25 MG 24 hr tablet Take 25 mg by mouth daily.    [provider]  prednisoLONE acetate (PRED FORTE) 1 % ophthalmic suspension 1 drop See admin instructions. Left ear daily 07/14/19   [provider]  REPATHA  SURECLICK 140 MG/ML SOAJ Inject 140 mg under the skin every 14 days. 03/08/23   Von Grumbling, PA-C  rosuvastatin  (CRESTOR ) 10 MG tablet Take 1 tablet by mouth daily. 07/26/22   Knox Perl, MD  sildenafil  (VIAGRA ) 100 MG tablet Take 1 tablet (100 mg total) by mouth daily as needed for erectile dysfunction. 11/20/21   Knox Perl, MD    Family History Family History  Problem Relation Age of Onset   Hyperlipidemia Mother    CAD Father        MI in 44s   Hypertension Father    Hypertension Sister    Hypertension Brother     Social History Social History   Tobacco Use   Smoking status: Former    Current packs/day: 0.00    Average packs/day: 0.5 packs/day for 15.0 years (7.5 ttl pk-yrs)    Types: Cigarettes    Start date: 2002    Quit date: 2017    Years since quitting: 8.3   Smokeless tobacco: Never  Vaping Use   Vaping status: Never Used  Substance Use Topics   Alcohol use: Yes    Comment: occasionally    Drug use: Never     Allergies   Propranolol    Review of Systems Review of Systems  Constitutional:  Negative for chills and fever.  Gastrointestinal:  Positive for diarrhea. Negative for abdominal pain, blood in stool, nausea and vomiting.     Physical Exam Triage Vital Signs ED Triage Vitals  Encounter Vitals Group     BP 08/06/23 1114 125/78     Systolic BP Percentile --      Diastolic BP Percentile --      Pulse Rate 08/06/23 1114 99     Resp 08/06/23 1114 17     Temp 08/06/23 1114 97.7 F (36.5 C)     Temp Source 08/06/23 1114 Oral     SpO2 08/06/23 1114 99 %     Weight 08/06/23 1114 175 lb (79.4 kg)     Height 08/06/23 1114 6' (1.829 m)     Head Circumference --      Peak Flow --      Pain Score 08/06/23 1126 0     Pain Loc --      Pain Education --      Exclude from Growth Chart --    No data found.  Updated Vital Signs BP 125/78 (BP Location: Right Arm)   Pulse 99   Temp 97.7 F (36.5 C) (Oral)   Resp 17   Ht 6'  (1.829 m)   Wt 175 lb (79.4 kg)   SpO2 99%   BMI 23.73 kg/m   Visual Acuity Right Eye Distance:   Left Eye Distance:   Bilateral Distance:    Right  Eye Near:   Left Eye Near:    Bilateral Near:     Physical Exam Vitals reviewed.  Constitutional:      General: He is awake.     Appearance: Normal appearance. He is well-developed and well-groomed.  HENT:     Head: Normocephalic and atraumatic.  Eyes:     Extraocular Movements: Extraocular movements intact.     Conjunctiva/sclera: Conjunctivae normal.  Pulmonary:     Effort: Pulmonary effort is normal.  Musculoskeletal:     Cervical back: Normal range of motion.  Neurological:     General: No focal deficit present.     Mental Status: He is alert and oriented to person, place, and time.     GCS: GCS eye subscore is 4. GCS verbal subscore is 5. GCS motor subscore is 6.  Psychiatric:        Attention and Perception: Attention normal.        Mood and Affect: Mood normal.        Speech: Speech normal.        Behavior: Behavior normal. Behavior is cooperative.      UC Treatments / Results  Labs (all labs ordered are listed, but only abnormal results are displayed) Labs Reviewed  GASTROINTESTINAL PANEL BY PCR, STOOL (REPLACES STOOL CULTURE)    EKG   Radiology No results found.  Procedures Procedures (including critical care time)  Medications Ordered in UC Medications - No data to display  Initial Impression / Assessment and Plan / UC Course  I have reviewed the triage vital signs and the nursing notes.  Pertinent labs & imaging results that were available during my care of the patient were reviewed by me and considered in my medical decision making (see chart for details).      Final Clinical Impressions(s) / UC Diagnoses   Final diagnoses:  Diarrhea of presumed infectious origin   Patient presents today with concerns for persistent diarrhea following recent travel to [place].  He reports on his last  day in [place] eating food from a street under.  He reports that while traveling back through the airports he did have nausea and vomiting but has now developed diarrhea.  He reports that the vomiting has stopped and he is able to tolerate p.o. intake without issues.  He denies fever, chills, choking or difficulty breathing.  He denies abdominal pain.  Given symptoms and ability to tolerate p.o. intake I am less suspicious for severe infectious process and recommend symptomatic management with over-the-counter medications and increased hydration efforts.  Patient is still concerned for potential, likely infection so we will complete stool testing with PCR for further evaluation.  Results to dictate further management especially if patient reports more severe symptoms.  For now recommend continued conservative measures as detailed above.  ED return precautions reviewed and provided in after visit summary.  Follow-up as needed.    Discharge Instructions      Today we discussed your concerns for ongoing diarrhea Diarrhea is usually self-limited and can have many causes The central tenants of management include the following: Staying well hydrated - this includes increasing water intake and supplementing with Pedialyte, Gatorade, etc. Try to eat a bland diet- this includes boiled chicken, brown rice, bananas, applesauce, plain toast, etc. As you are feeling better you can usually begin to slowly reincorporate your normal dietary choices back into your dietary consumption Using Imodium and Pepto Bismol as needed and according to manufacturers instructions to provide assistance with managing symptoms.  Diarrhea with the following will need to be monitored and may require further testing so please follow up with your PCP or go to the ED for more severe symptoms: bloody diarrhea, fever, weight loss, signs of dehydration, weakness, fatigue, incontinence, diarrhea lasting longer than 1-3 weeks   We will keep  you updated on the results of your lab work and any further action that may be needed as those results become available.       ED Prescriptions   None    PDMP not reviewed this encounter.   Jerona Mooring, PA-C 08/06/23 1258

## 2023-08-06 NOTE — ED Triage Notes (Addendum)
 Pt presents with complaints of diarrhea x 2 days. Pt states he recently traveled back from [place] 4/17, ate "street food" before leaving. Vomited on the plane ride home, now is having non-stop diarrhea. Pt denies fevers at home. OTC Pepto-Bismol taken with no relief. Denies pain at this time.

## 2023-08-06 NOTE — Discharge Instructions (Addendum)
 Today we discussed your concerns for ongoing diarrhea Diarrhea is usually self-limited and can have many causes The central tenants of management include the following: Staying well hydrated - this includes increasing water intake and supplementing with Pedialyte, Gatorade, etc. Try to eat a bland diet- this includes boiled chicken, brown rice, bananas, applesauce, plain toast, etc. As you are feeling better you can usually begin to slowly reincorporate your normal dietary choices back into your dietary consumption Using Imodium and Pepto Bismol as needed and according to manufacturers instructions to provide assistance with managing symptoms.   Diarrhea with the following will need to be monitored and may require further testing so please follow up with your PCP or go to the ED for more severe symptoms: bloody diarrhea, fever, weight loss, signs of dehydration, weakness, fatigue, incontinence, diarrhea lasting longer than 1-3 weeks   We will keep you updated on the results of your lab work and any further action that may be needed as those results become available.

## 2023-08-07 ENCOUNTER — Ambulatory Visit

## 2023-08-13 LAB — LIPID PANEL
Chol/HDL Ratio: 3.7 ratio (ref 0.0–5.0)
Cholesterol, Total: 128 mg/dL (ref 100–199)
HDL: 35 mg/dL — ABNORMAL LOW (ref >39)
LDL Chol Calc (NIH): 33 mg/dL (ref 0–99)
Triglycerides: 423 mg/dL — ABNORMAL HIGH (ref 0–149)
VLDL Cholesterol Cal: 60 mg/dL — ABNORMAL HIGH (ref 5–40)

## 2023-08-13 LAB — HEPATIC FUNCTION PANEL
ALT: 11 IU/L (ref 0–44)
AST: 15 IU/L (ref 0–40)
Albumin: 4.6 g/dL (ref 4.1–5.1)
Alkaline Phosphatase: 87 IU/L (ref 44–121)
Bilirubin Total: <0.2 mg/dL (ref 0.0–1.2)
Bilirubin, Direct: 0.12 mg/dL (ref 0.00–0.40)
Total Protein: 7.7 g/dL (ref 6.0–8.5)

## 2023-08-17 ENCOUNTER — Telehealth: Payer: Self-pay | Admitting: *Deleted

## 2023-08-17 DIAGNOSIS — I25119 Atherosclerotic heart disease of native coronary artery with unspecified angina pectoris: Secondary | ICD-10-CM

## 2023-08-17 DIAGNOSIS — I25118 Atherosclerotic heart disease of native coronary artery with other forms of angina pectoris: Secondary | ICD-10-CM

## 2023-08-17 DIAGNOSIS — E782 Mixed hyperlipidemia: Secondary | ICD-10-CM

## 2023-08-17 NOTE — Telephone Encounter (Signed)
-----   Message from Marlyse Single sent at 08/15/2023  9:31 PM EDT ----- Gisele Lamas. Recheck Lipids in 6 mos. Order under Maurice Sousa, PA-C    08/15/2023 9:31 PM

## 2023-08-17 NOTE — Telephone Encounter (Signed)
 The patient has been notified of the result and verbalized understanding.  All questions (if any) were answered.  Pt aware we will order for him to get repeat lipids in 6 months, and to write this down on his calendar to go and have done at that time, to a [facility] near him.  Order placed and released in the system.  Pt aware to go fasting to this lab appointment.  Pt states he lives 5 mins from a [facility] and will mark this down on his calendar to do at that time.   Pt verbalized understanding and agrees with this plan.

## 2023-08-23 ENCOUNTER — Ambulatory Visit: Attending: Cardiology | Admitting: Pharmacist

## 2023-08-23 ENCOUNTER — Encounter: Payer: Self-pay | Admitting: Pharmacist

## 2023-08-23 VITALS — Wt 183.0 lb

## 2023-08-23 DIAGNOSIS — I25118 Atherosclerotic heart disease of native coronary artery with other forms of angina pectoris: Secondary | ICD-10-CM | POA: Diagnosis not present

## 2023-08-23 DIAGNOSIS — E782 Mixed hyperlipidemia: Secondary | ICD-10-CM

## 2023-08-23 MED ORDER — FENOFIBRATE 54 MG PO TABS: 54.0000 mg | ORAL_TABLET | Freq: Every day | ORAL | 1 refills | Status: DC

## 2023-08-23 NOTE — Progress Notes (Signed)
 Patient ID: Alez Rivette                 DOB: [birth year]                    MRN: [id number]     HPI: Philip [name] is a 48 y.o. male patient referred to lipid clinic by Lovette Rud. PMH is significant for HTN, angina, T2DM, CAD, and hypertriglyceridemia. Patient is intolerant to high dose statins.  Patient presents today in good spirits. 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 Does not typically eat breakfast or lunch but has large dinner. Drinks water and coffee throughout the day.  Returned from trip to [place] to take care of his father. Was not able to take his medications faithfully and his diet had changed. Now that [place] back home in [place]  he is back on his medications and blood sugar is controlled. Wearing Dexcom,blood sugar reading 103 in room.   Currently taking rosuvastatin  10mg  once daily. Dosages higher than 10mg  cause myalgias. Plans to take for a week or two until myalgias start. Is also on Repatha , Zetia , and Vascepa   Current Medications:  Vascepa  2g BID Repatha  140mg  sq q 2 weeks Rosuvastatin  10mg  daily (does not take frequently) Ezetimibe  10mg  daily  Intolerances: Rosuvastatin  dosages >10mg   Risk Factors:  CAD T2DM Former smoker  LDL goal: <55 Triglyceride goal: <150  Labs: TC 128, Trigs 423, HDL 35, LDL 33  Past Medical History:  Diagnosis Date   Chest pain 2011   Diabetes mellitus    Hyperlipidemia    Hypertension    Reflux     Current Outpatient Medications on File Prior to Visit  Medication Sig Dispense Refill   aspirin  (ASPIRIN  LOW DOSE) 81 MG chewable tablet Chew 1 tablet (81 mg total) by mouth daily. 90 tablet 3   atenolol  (TENORMIN ) 25 MG tablet TAKE 1 TABLET(25 MG TOTAL) BY MOUTH AS DIRECTED. TAKE 1 TO 2 TABLETS DAILY FOR ANXIETY 60 tablet 0   buPROPion  (WELLBUTRIN  XL) 150 MG 24 hr tablet Take 1 tablet by mouth every morning. 90 tablet 0   Continuous Glucose Sensor (DEXCOM G7 SENSOR) MISC as directed.      cyclobenzaprine (FLEXERIL) 5 MG tablet Take 5 mg by mouth at bedtime.     empagliflozin  (JARDIANCE ) 25 MG TABS tablet Take 1 tablet (25 mg total) by mouth daily before breakfast. 90 tablet 3   ezetimibe  (ZETIA ) 10 MG tablet Take 1 tablet by mouth daily at bedtime for cholesterol. 90 tablet 0   icosapent  Ethyl (VASCEPA ) 1 g capsule Take 2 capsules (2 g total) by mouth 2 (two) times daily. 360 capsule 3   ketoconazole (NIZORAL) 2 % shampoo SMARTSIG:5 Milliliter(s) Topical 2-3 Times Weekly     metFORMIN (GLUCOPHAGE) 1000 MG tablet Take 1,000 mg by mouth daily with breakfast.     nitroGLYCERIN  (NITROSTAT ) 0.4 MG SL tablet PLACE 1 TABLET (0.4 MG TOTAL) UNDER THE TONGUE EVERY 5 (FIVE) MINUTES AS NEEDED FOR CHEST PAIN. 25 tablet 3   ofloxacin (FLOXIN) 0.3 % OTIC solution Place 5 drops into both ears daily.     olmesartan  (BENICAR ) 20 MG tablet Take 1 tablet by mouth daily in the evening. 90 tablet 2   pantoprazole (PROTONIX) 40 MG tablet Take 40 mg by mouth daily.     PARoxetine (PAXIL-CR) 25 MG 24 hr tablet Take 25 mg by mouth daily.     prednisoLONE acetate (PRED FORTE)  1 % ophthalmic suspension 1 drop See admin instructions. Left ear daily     REPATHA  SURECLICK 140 MG/ML SOAJ Inject 140 mg under the skin every 14 days. 2 mL 6   rosuvastatin  (CRESTOR ) 10 MG tablet Take 1 tablet by mouth daily. 90 tablet 2   sildenafil  (VIAGRA ) 100 MG tablet Take 1 tablet (100 mg total) by mouth daily as needed for erectile dysfunction. 30 tablet 3   No current facility-administered medications on file prior to visit.    Allergies  Allergen Reactions   Propranolol  Other (See Comments)    Extreme anxiety  Pt states he is not allergic to this medication.     Assessment/Plan:  1. Hyperlipidemia - Patient last LDL of 33 is at goal of <55. However triglycerides of 423 are above goal of <150. Possibly due to diet changes while traveling overseas.  Discussed diet with patient. Recommended whole grains, healthy  fats, lean proteins, and vegetables. Will start low dose fenofibrate at this time and check LFTs in 1-2 weeks. Recheck lipid panel in 3 months.  Continue: Vascepa  2g BID Repatha  140mg  sq q 2 weeks Rosuvastatin  10mg  daily (does not take frequently) Ezetimibe  10mg  daily  Start Fenofibrate 54mg  daily Check LFTs in 1-2 weeks Recheck lipid panel in 3 months  Joelene Murrain, PharmD, BCACP, CDCES, CPP [address], [address] [place], [place], [address] Phone: [phone], Fax: [phone]

## 2023-08-23 NOTE — Patient Instructions (Signed)
  It was nice meeting you today  We would like your triglycerides to be less than 150  Please continue your: Repatha  140mg  every 2 weeks Ezetimbe 10mg  daily Vascepa  2 grams twice a day  I have placed an order for fenofibrate 54 mg once a day  I have also placed a lab order for you to have your liver function tested in about 1-2 weeks  Please update your fasting lipid panel in about 3 months  Joelene Murrain, PharmD, BCACP, CDCES, CPP [address] [place], [place], [address] Phone: [phone], Fax: [phone]

## 2023-08-26 ENCOUNTER — Other Ambulatory Visit: Payer: Self-pay | Admitting: Physician Assistant

## 2023-08-30 ENCOUNTER — Encounter: Payer: Self-pay | Admitting: Pharmacist

## 2023-08-30 LAB — LAB REPORT - SCANNED
A1c: 5.5
EGFR (Non-African Amer.): 75

## 2023-09-25 NOTE — Progress Notes (Deleted)
 Cardiology Office Note:  .   Date:  09/25/2023  ID:  Philip [name], DOB Jul 09, 1975, MRN [id number] PCP: Wilburn Handler, MD  [facility] Providers Cardiologist:  Knox Perl, MD { Click to update primary MD,subspecialty MD or APP then REFRESH:1}  History of Present Illness: .   Philip [name] is a 48 y.o. [place] American male with hypertension, diabetes melitus, strong family history of early CAD with father having had MI l in his 14s, with chest pain. seen by us  on 10/16/2019 for symptoms suggestive of unstable angina pectoris, cardiac catheterization on 10/18/2019 revealed moderate disease in the proximal LAD and recommended medical therapy.  Patient also has extreme anxiety and panic attacks history and is being followed by therapist.  He is also being managed in our lipid clinic, recent visit on 08/23/2023, was added on Trilipix 54 mg daily and repeat lipid profile recommended in 3 months.  He was also seen about 2 months ago for chest pain in our office, underwent coronary CTA on 05/21/2023 which revealed only minimal disease with significant improvement and remodeling of moderate disease in the LAD but still persistent severe plaque volume of 451 mm at the 91st percentile.    Discussed the use of AI scribe software for clinical note transcription with the patient, who gave verbal consent to proceed.  History of Present Illness   Labs   Lab Results  Component Value Date   CHOL 128 08/12/2023   HDL 35 (L) 08/12/2023   LDLCALC 33 08/12/2023   LDLDIRECT 45 12/17/2019   TRIG 423 (H) 08/12/2023   CHOLHDL 3.7 08/12/2023   Lab Results  Component Value Date   NA 142 05/10/2023   K 4.7 05/10/2023   CO2 24 05/10/2023   GLUCOSE 74 05/10/2023   BUN 16 05/10/2023   CREATININE 1.30 (H) 05/10/2023   CALCIUM  10.0 05/10/2023   EGFR 69 05/10/2023   GFRNONAA 75 08/29/2023      Latest Ref Rng & Units 05/10/2023    4:28 PM 03/08/2023   12:11 PM 12/17/2019   10:29 AM  BMP   Glucose 70 - 99 mg/dL 74  74  98   BUN 6 - 24 mg/dL 16  14  11    Creatinine 0.76 - 1.27 mg/dL 4.09  8.11  9.14   BUN/Creat Ratio 9 - 20 12  11  8    Sodium 134 - 144 mmol/L 142  140  139   Potassium 3.5 - 5.2 mmol/L 4.7  4.6  5.3   Chloride 96 - 106 mmol/L 102  103  102   CO2 20 - 29 mmol/L 24  23  23    Calcium  8.7 - 10.2 mg/dL 78.2  9.4  9.9     No results found for: "TSH"  External Labs:  ***  ROS  ***ROS  Physical Exam:   VS:  There were no vitals taken for this visit.   Wt Readings from Last 3 Encounters:  08/23/23 183 lb (83 kg)  08/06/23 175 lb (79.4 kg)  06/22/23 185 lb 9.6 oz (84.2 kg)    ***Physical Exam Studies Reviewed: Aaron Aas    Left heart catheterization 10/18/2019:    Coronary CTA on 05/21/2023 Total coronary calcium  score of 240, unable to accurately provide percentile analysis. Total plaque volume (TPV) 451 mm3 which is 91st percentile for age- and sex-matched controls (calcified plaque 52 mm3; non-calcified plaque 399 mm3 (low attenuation plaque 15 mm3)). TPV is severe.  Proximal to mid LAD mixed plaque  0 to 24% stenosis. LCx mid has 0 to 24% stenosis. RCA proximal 0 to 24% stenosis.  EKG:         Medications and allergies    Allergies  Allergen Reactions   Propranolol  Other (See Comments)    Extreme anxiety  Pt states he is not allergic to this medication.      Current Outpatient Medications:    aspirin  (ASPIRIN  LOW DOSE) 81 MG chewable tablet, Chew 1 tablet (81 mg total) by mouth daily., Disp: 90 tablet, Rfl: 3   atenolol  (TENORMIN ) 25 MG tablet, TAKE 1 TABLET(25 MG TOTAL) BY MOUTH AS DIRECTED. TAKE 1 TO 2 TABLETS DAILY FOR ANXIETY, Disp: 60 tablet, Rfl: 0   buPROPion  (WELLBUTRIN  XL) 150 MG 24 hr tablet, Take 1 tablet by mouth every morning., Disp: 90 tablet, Rfl: 0   Continuous Glucose Sensor (DEXCOM G7 SENSOR) MISC, as directed., Disp: , Rfl:    cyclobenzaprine (FLEXERIL) 5 MG tablet, Take 5 mg by mouth at bedtime., Disp: , Rfl:     empagliflozin  (JARDIANCE ) 25 MG TABS tablet, Take 1 tablet (25 mg total) by mouth daily before breakfast., Disp: 90 tablet, Rfl: 3   Evolocumab  (REPATHA  SURECLICK) 140 MG/ML SOAJ, Inject 140 mg under the skin every 14 days. Stop Crestor  4 weeks after starting Repatha ., Disp: 6 mL, Rfl: 3   ezetimibe  (ZETIA ) 10 MG tablet, Take 1 tablet by mouth daily at bedtime for cholesterol., Disp: 90 tablet, Rfl: 0   fenofibrate  54 MG tablet, Take 1 tablet (54 mg total) by mouth daily., Disp: 90 tablet, Rfl: 1   icosapent  Ethyl (VASCEPA ) 1 g capsule, Take 2 capsules (2 g total) by mouth 2 (two) times daily., Disp: 360 capsule, Rfl: 3   ketoconazole (NIZORAL) 2 % shampoo, SMARTSIG:5 Milliliter(s) Topical 2-3 Times Weekly, Disp: , Rfl:    metFORMIN (GLUCOPHAGE) 1000 MG tablet, Take 1,000 mg by mouth daily with breakfast., Disp: , Rfl:    nitroGLYCERIN  (NITROSTAT ) 0.4 MG SL tablet, PLACE 1 TABLET (0.4 MG TOTAL) UNDER THE TONGUE EVERY 5 (FIVE) MINUTES AS NEEDED FOR CHEST PAIN., Disp: 25 tablet, Rfl: 3   ofloxacin (FLOXIN) 0.3 % OTIC solution, Place 5 drops into both ears daily., Disp: , Rfl:    olmesartan  (BENICAR ) 20 MG tablet, Take 1 tablet by mouth daily in the evening., Disp: 90 tablet, Rfl: 2   pantoprazole (PROTONIX) 40 MG tablet, Take 40 mg by mouth daily., Disp: , Rfl:    PARoxetine (PAXIL-CR) 25 MG 24 hr tablet, Take 25 mg by mouth daily., Disp: , Rfl:    prednisoLONE acetate (PRED FORTE) 1 % ophthalmic suspension, 1 drop See admin instructions. Left ear daily, Disp: , Rfl:    rosuvastatin  (CRESTOR ) 10 MG tablet, Take 1 tablet by mouth daily., Disp: 90 tablet, Rfl: 2   sildenafil  (VIAGRA ) 100 MG tablet, Take 1 tablet (100 mg total) by mouth daily as needed for erectile dysfunction., Disp: 30 tablet, Rfl: 3   No orders of the defined types were placed in this encounter.    There are no discontinued medications.   ASSESSMENT AND PLAN: .      ICD-10-CM   1. Coronary artery disease involving native  coronary artery of native heart without angina pectoris  I25.10     2. Mixed hyperlipidemia  E78.2     3. Primary hypertension  I10       Assessment and Plan Assessment & Plan   Signed,  Knox Perl, MD, [facility] 09/25/2023, 5:07 PM [facility] [address] [place], [place] [address] Phone: [phone]. Fax:  [phone]

## 2023-09-26 ENCOUNTER — Ambulatory Visit: Admitting: Cardiology

## 2023-09-26 DIAGNOSIS — I10 Essential (primary) hypertension: Secondary | ICD-10-CM

## 2023-09-26 DIAGNOSIS — I2510 Atherosclerotic heart disease of native coronary artery without angina pectoris: Secondary | ICD-10-CM

## 2023-09-26 DIAGNOSIS — E782 Mixed hyperlipidemia: Secondary | ICD-10-CM

## 2023-10-30 ENCOUNTER — Other Ambulatory Visit: Payer: Self-pay | Admitting: Cardiology

## 2023-10-30 DIAGNOSIS — I25118 Atherosclerotic heart disease of native coronary artery with other forms of angina pectoris: Secondary | ICD-10-CM

## 2023-10-30 DIAGNOSIS — F410 Panic disorder [episodic paroxysmal anxiety] without agoraphobia: Secondary | ICD-10-CM

## 2023-10-30 DIAGNOSIS — R002 Palpitations: Secondary | ICD-10-CM

## 2023-11-18 ENCOUNTER — Ambulatory Visit: Attending: Cardiology | Admitting: Cardiology

## 2023-11-18 ENCOUNTER — Encounter: Payer: Self-pay | Admitting: Cardiology

## 2023-11-18 VITALS — BP 103/70 | HR 77 | Resp 16 | Ht 72.0 in | Wt 185.0 lb

## 2023-11-18 DIAGNOSIS — F410 Panic disorder [episodic paroxysmal anxiety] without agoraphobia: Secondary | ICD-10-CM | POA: Diagnosis not present

## 2023-11-18 DIAGNOSIS — E782 Mixed hyperlipidemia: Secondary | ICD-10-CM

## 2023-11-18 DIAGNOSIS — I10 Essential (primary) hypertension: Secondary | ICD-10-CM

## 2023-11-18 DIAGNOSIS — I2510 Atherosclerotic heart disease of native coronary artery without angina pectoris: Secondary | ICD-10-CM | POA: Diagnosis not present

## 2023-11-18 MED ORDER — REPATHA SURECLICK 140 MG/ML ~~LOC~~ SOAJ: 140.0000 mg | SUBCUTANEOUS | 3 refills | Status: AC

## 2023-11-18 NOTE — Patient Instructions (Signed)

## 2023-11-18 NOTE — Progress Notes (Signed)
 Cardiology Office Note:  .   Date:  11/18/2023  ID:  Kerney Hildegard Delton, DOB 11/12/1975, MRN [id number] PCP: Leigh Lung, MD  [facility] Providers Cardiologist:  Gordy Bergamo, MD   History of Present Illness: .   Philip [name] is a 48 y.o. [place] American male with hypertension, diabetes melitus, strong family history of early CAD with father having had MI l in his 35s, with chest pain. seen by us  on 10/16/2019 for symptoms suggestive of unstable angina pectoris, cardiac catheterization on 10/18/2019 revealed moderate disease in the proximal LAD and recommended medical therapy.  He has had no recurrence of angina since medical therapy, due to statin intolerance, was started on Repatha .  Left heart catheterization 10/18/2019:  Proximal LAD tandem 50 and 40% stenosis with mild ectasia distally, D2 medium sized with proximal 50 to 60% stenosis.  IVUS to the LAD not significant stenosis.  Recommended medical therapy.  CT CORONARY MORPH W/CTA COR W/SCORE 05/12/2023 Proximal to mid LAD luminal calcification and mixed plaque 0 to 24% stenosis.  LCx minimal calcified plaque mid.  RCA minimal calcified plaque in the right posterolateral branch.  Normal aortic size. Discussed the use of AI scribe software for clinical note transcription with the patient, who gave verbal consent to proceed.  History of Present Illness Philip Blandon Offerdahl is a 48 year old male with coronary artery disease and diabetes who presents for follow-up after recent travel and medication adjustments.  He has a 60-70% blockage in the LAD and was previously on Crestor , which he discontinued due to myalgia. He is now on Repatha . Recent travel to [place] disrupted his medication routine, leading to dehydration and a temporary weight loss of five pounds. His triglycerides were initially elevated at 423 mg/dL but improved to 871 mg/dL after resuming his regular diet and medication. His total cholesterol is 126 mg/dL, and HDL is  47 mg/dL. He is currently taking Vascepa  1 gram twice a day and fenofibrate .  His diabetes is managed with Jardiance  25 mg daily, with an A1c of 5.5%. He has stopped taking metformin in the morning.  He takes atenolol  25 mg once daily and olmesartan  20 mg, maintaining blood pressure at 103/70 mmHg.  He experiences a significant reduction in panic attacks, attributing this to less stress from family issues and therapy. He continues to manage stress related to his family, particularly his parents.  Labs   Lab Results  Component Value Date   CHOL 128 08/12/2023   HDL 35 (L) 08/12/2023   LDLCALC 33 08/12/2023   LDLDIRECT 45 12/17/2019   TRIG 423 (H) 08/12/2023   CHOLHDL 3.7 08/12/2023   08/29/23:  TC 126, TG 128, HDL 47, LDL 58  HbA1c 5.5%   Lipoprotein (a)  Date/Time Value Ref Range Status  12/17/2019 10:29 AM 19.9 <75.0 nmol/L Final    Comment:    Note:  Values greater than or equal to 75.0 nmol/L may        indicate an independent risk factor for CHD,        but must be evaluated with caution when applied        to non-Caucasian populations due to the        influence of genetic factors on Lp(a) across        ethnicities.     Lab Results  Component Value Date   NA 142 05/10/2023   K 4.7 05/10/2023   CO2 24 05/10/2023   GLUCOSE 74 05/10/2023   BUN  16 05/10/2023   CREATININE 1.30 (H) 05/10/2023   CALCIUM  10.0 05/10/2023   EGFR 69 05/10/2023   GFRNONAA 75 08/29/2023      Latest Ref Rng & Units 05/10/2023    4:28 PM 03/08/2023   12:11 PM 12/17/2019   10:29 AM  BMP  Glucose 70 - 99 mg/dL 74  74  98   BUN 6 - 24 mg/dL 16  14  11    Creatinine 0.76 - 1.27 mg/dL 8.69  8.71  8.69   BUN/Creat Ratio 9 - 20 12  11  8    Sodium 134 - 144 mmol/L 142  140  139   Potassium 3.5 - 5.2 mmol/L 4.7  4.6  5.3   Chloride 96 - 106 mmol/L 102  103  102   CO2 20 - 29 mmol/L 24  23  23    Calcium  8.7 - 10.2 mg/dL 89.9  9.4  9.9       Latest Ref Rng & Units 10/18/2019   10:06 AM  12/30/2011   12:55 PM 11/07/2009    4:55 AM  CBC  WBC 4.0 - 10.5 K/uL 7.0  10.1  10.0   Hemoglobin 13.0 - 17.0 g/dL 87.0  84.7  86.8   Hematocrit 39.0 - 52.0 % 41.3  44.8  39.0   Platelets 150 - 400 K/uL 260  250  200     ROS  ROS Physical Exam:   VS:  BP 103/70 (BP Location: Left Arm, Patient Position: Sitting, Cuff Size: Normal)   Pulse 77   Resp 16   Ht 6' (1.829 m)   Wt 185 lb (83.9 kg)   SpO2 99%   BMI 25.09 kg/m    Wt Readings from Last 3 Encounters:  11/18/23 185 lb (83.9 kg)  08/23/23 183 lb (83 kg)  08/06/23 175 lb (79.4 kg)    Physical Exam Studies Reviewed: SABRA    Left heart catheterization 10/18/2019:  Proximal LAD tandem 50 and 40% stenosis with mild ectasia distally, D2 medium sized with proximal 50 to 60% stenosis.  IVUS to the LAD not significant stenosis.  Recommended medical therapy.   CT CORONARY MORPH W/CTA COR W/SCORE 05/12/2023  Proximal to mid LAD luminal calcification and mixed plaque 0 to 24% stenosis.  LCx minimal calcified plaque mid.  RCA minimal calcified plaque in the right posterolateral branch.  Normal aortic size.  EKG:         Medications ordered    No orders of the defined types were placed in this encounter.    ASSESSMENT AND PLAN: .      ICD-10-CM   1. Coronary artery disease of native artery of native heart with stable angina pectoris (HCC)  I25.118     2. Mixed hyperlipidemia  E78.2     3. Primary hypertension  I10     4. Panic attack  F41.0       Assessment and Plan Assessment & Plan Stable coronary artery disease, history of LAD stenosis, without current angina Coronary artery disease is well-managed with no current angina. LAD stenosis has improved significantly with medical therapy, reducing from 60-70% blockage to 20% calcified, no longer meeting the definition of coronary artery disease by definition. - Continue current medical therapy. - Release to primary care physician for ongoing management.  Mixed  hyperlipidemia, on Repatha  and Vascepa , fenofibrate  discontinued Mixed hyperlipidemia is well-controlled. Triglycerides have decreased from 423 to 128. Fenofibrate  is discontinued due to excellent lipid control. Repatha  and Vascepa  are continued. He  reports no side effects from Repatha  and prefers it over statins due to previous muscle pain with statins. - Discontinue fenofibrate . - Continue Repatha  and Vascepa . - Check lipid panel in 3-6 months. - If triglycerides remain stable, reduce Vascepa  to 1 gram twice a day and can eventually discontinue suspect elevated triglycerides were previously related to poor diet and also uncontrolled diabetes mellitus.  Essential hypertension, well controlled on atenolol  and olmesartan  Hypertension is well-controlled with current medication regimen. Blood pressure is 103/70 mmHg. - Continue atenolol  25 mg once daily. - Continue olmesartan  20 mg once daily.  Type 2 diabetes mellitus, well controlled on Jardiance  Type 2 diabetes is well-controlled with an A1c of 5.5%. Jardiance  is effective in managing blood glucose levels. He has discontinued metformin and is managing diabetes with Jardiance  alone. - Continue Jardiance  25 mg daily.  Panic disorder, improved with therapy Panic disorder has improved significantly with therapy. Less stress related to family issues. Therapy is beneficial. - Continue therapy for panic disorder.  I will see him back on a as needed basis.   Signed,  Gordy Bergamo, MD, [facility] 11/18/2023, 10:17 AM [facility] [address] [place], [place] [address] Phone: [phone]. Fax:  [phone]

## 2023-11-20 ENCOUNTER — Other Ambulatory Visit: Payer: Self-pay | Admitting: Cardiology

## 2023-11-20 DIAGNOSIS — E782 Mixed hyperlipidemia: Secondary | ICD-10-CM

## 2023-11-28 ENCOUNTER — Other Ambulatory Visit: Payer: Self-pay | Admitting: Cardiology

## 2023-11-28 DIAGNOSIS — E782 Mixed hyperlipidemia: Secondary | ICD-10-CM

## 2024-02-06 ENCOUNTER — Encounter: Payer: Self-pay | Admitting: Cardiology

## 2024-02-17 ENCOUNTER — Other Ambulatory Visit: Payer: Self-pay

## 2024-02-17 DIAGNOSIS — I25118 Atherosclerotic heart disease of native coronary artery with other forms of angina pectoris: Secondary | ICD-10-CM

## 2024-02-17 MED ORDER — ASPIRIN 81 MG PO CHEW: 81.0000 mg | CHEWABLE_TABLET | Freq: Every day | ORAL | 2 refills | Status: AC

## 2024-03-23 ENCOUNTER — Telehealth: Payer: Self-pay | Admitting: Cardiology

## 2024-03-23 ENCOUNTER — Other Ambulatory Visit (HOSPITAL_COMMUNITY): Payer: Self-pay

## 2024-03-23 ENCOUNTER — Telehealth: Payer: Self-pay | Admitting: Pharmacy Technician

## 2024-03-23 NOTE — Telephone Encounter (Signed)
   Pharmacy Patient Advocate Encounter   Received notification from Pt Calls Messages that prior authorization for REPATHA  is required/requested.   Insurance verification completed.   The patient is insured through [employer].   Per test claim: PA required; PA submitted to above mentioned insurance via Latent Key/confirmation #/EOC [id number] Status is pending   And faxed to insurance since legacy form

## 2024-03-23 NOTE — Telephone Encounter (Signed)
 Pt c/o medication issue:  1. Name of Medication:   Evolocumab  (REPATHA  SURECLICK) 140 MG/ML SOAJ   2. How are you currently taking this medication (dosage and times per day)?   3. Are you having a reaction (difficulty breathing--STAT)?   4. What is your medication issue?   Caller Shellee) is following up on patient's prior authorization for this medication sent on fax dated 12/2.  Prior Authorization Ref# [id number].

## 2024-03-23 NOTE — Telephone Encounter (Signed)
 Prior authorization sent and encounter made for follow up

## 2024-03-26 ENCOUNTER — Other Ambulatory Visit (HOSPITAL_COMMUNITY): Payer: Self-pay
